# Patient Record
Sex: Female | Born: 1970 | Hispanic: Yes | Smoking: Current some day smoker
Health system: Southern US, Community
[De-identification: ages and names within clinical notes are randomized; demographics above are authoritative.]

## PROBLEM LIST (undated history)

## (undated) DIAGNOSIS — F310 Bipolar disorder, current episode hypomanic: Secondary | ICD-10-CM

## (undated) DIAGNOSIS — J45909 Unspecified asthma, uncomplicated: Secondary | ICD-10-CM

## (undated) HISTORY — DX: Bipolar disorder, current episode hypomanic: F31.0

## (undated) HISTORY — PX: OTHER SURGICAL HISTORY: SHX169

---

## 1995-12-18 HISTORY — PX: CHOLECYSTECTOMY: SHX55

## 2015-02-17 ENCOUNTER — Emergency Department (INDEPENDENT_AMBULATORY_CARE_PROVIDER_SITE_OTHER)
Admission: EM | Admit: 2015-02-17 | Discharge: 2015-02-17 | Disposition: A | Discharge status: Home / Self Care | Payer: Self-pay | Source: Home / Self Care | Attending: Family Medicine | Admitting: Family Medicine

## 2015-02-17 ENCOUNTER — Encounter (HOSPITAL_COMMUNITY): Payer: Self-pay | Admitting: *Deleted

## 2015-02-17 DIAGNOSIS — B356 Tinea cruris: Secondary | ICD-10-CM

## 2015-02-17 MED ORDER — NYSTATIN 100000 UNIT/GM EX CREA: TOPICAL_CREAM | CUTANEOUS | Status: DC

## 2015-02-17 NOTE — ED Notes (Signed)
Pt  Reports  Symptoms    Of  A  White  Spot   -  In  Vaginal  Area         That  Itches   She  Has   No  Discharge   She  Does however  Have  A  Rash  l  siode  Of  Vaginal  Area   denys  Any pain   denys  Any   Urinary  Symptoms

## 2015-02-17 NOTE — ED Provider Notes (Signed)
CSN: [id number]     Arrival date & time 02/17/15  7416 History   First MD Initiated Contact with Patient 02/17/15 1004     Chief Complaint  Patient presents with  . Vaginal Itching   (Consider location/radiation/quality/duration/timing/severity/associated sxs/prior Treatment) HPI Comments: Patient presents to report a two week history of a pruritic rash at bilateral groin folds and labia majora and minora. Has not tried any medications at home. Denies vaginal discharge or irregular bleeding. Reports herself to be otherwise healthy.  PCP: none  Patient is a 44 y.o. female presenting with vaginal itching. The history is provided by the patient.  Vaginal Itching This is a new problem.    History reviewed. No pertinent past medical history. History reviewed. No pertinent past surgical history. History reviewed. No pertinent family history. History  Substance Use Topics  . Smoking status: Never Smoker   . Smokeless tobacco: Not on file  . Alcohol Use: No   OB History    No data available     Review of Systems  Constitutional: Negative.   Genitourinary: Negative for dysuria, urgency, frequency, hematuria, flank pain, vaginal bleeding, vaginal discharge, difficulty urinating, genital sores, vaginal pain, menstrual problem and pelvic pain.  Musculoskeletal: Negative.   Skin: Negative.     Allergies  Review of patient's allergies indicates no known allergies.  Home Medications   Prior to Admission medications   Medication Sig Start Date End Date Taking? Authorizing Provider  nystatin cream (MYCOSTATIN) Apply to affected area 2 times daily x 14 days 02/17/15   Annett Gula H Presson, PA   BP 116/72 mmHg  Pulse 78  Temp(Src) 98.6 F (37 C) (Oral)  Resp 18  SpO2 100%  LMP 01/23/2015 Physical Exam  Constitutional: She is oriented to person, place, and time. She appears well-developed and well-nourished. No distress.  Cardiovascular: Normal rate.   Pulmonary/Chest: Effort  normal.  Genitourinary:    Pelvic exam was performed with patient supine. There is rash and lesion on the right labia. There is no tenderness or injury on the right labia. There is rash and lesion on the left labia. There is no tenderness or injury on the left labia. No erythema, tenderness or bleeding in the vagina. No foreign body around the vagina. No signs of injury around the vagina. No vaginal discharge found.  Outlined areas are regions of tinea cruris  Neurological: She is alert and oriented to person, place, and time.  Skin: Skin is warm and dry.  Psychiatric: She has a normal mood and affect. Her behavior is normal.  Nursing note and vitals reviewed.   ED Course  Procedures (including critical care time) Labs Review Labs Reviewed - No data to display  Imaging Review No results found.   MDM   1. Tinea cruris    Nystatin cream to affected areas x 14 days and if no improvement, please follow up at [facility] as patient has no access to outpatient primary or ObGyn care.     Lutricia Feil, [place] 02/17/15 1050

## 2016-02-15 ENCOUNTER — Emergency Department (HOSPITAL_COMMUNITY): Payer: Medicaid Other

## 2016-02-15 ENCOUNTER — Emergency Department (HOSPITAL_COMMUNITY)
Admission: EM | Admit: 2016-02-15 | Discharge: 2016-02-15 | Disposition: A | Discharge status: Home / Self Care | Payer: Medicaid Other | Attending: Emergency Medicine | Admitting: Emergency Medicine

## 2016-02-15 ENCOUNTER — Encounter (HOSPITAL_COMMUNITY): Payer: Self-pay | Admitting: *Deleted

## 2016-02-15 DIAGNOSIS — Y288XXA Contact with other sharp object, undetermined intent, initial encounter: Secondary | ICD-10-CM | POA: Insufficient documentation

## 2016-02-15 DIAGNOSIS — S61219A Laceration without foreign body of unspecified finger without damage to nail, initial encounter: Secondary | ICD-10-CM

## 2016-02-15 DIAGNOSIS — Y9289 Other specified places as the place of occurrence of the external cause: Secondary | ICD-10-CM | POA: Insufficient documentation

## 2016-02-15 DIAGNOSIS — Y998 Other external cause status: Secondary | ICD-10-CM | POA: Insufficient documentation

## 2016-02-15 DIAGNOSIS — Y9389 Activity, other specified: Secondary | ICD-10-CM | POA: Insufficient documentation

## 2016-02-15 DIAGNOSIS — Z23 Encounter for immunization: Secondary | ICD-10-CM | POA: Insufficient documentation

## 2016-02-15 DIAGNOSIS — S61213A Laceration without foreign body of left middle finger without damage to nail, initial encounter: Secondary | ICD-10-CM | POA: Insufficient documentation

## 2016-02-15 MED ORDER — IBUPROFEN 400 MG PO TABS
800.0000 mg | ORAL_TABLET | Freq: Once | ORAL | Status: AC
  Administered 2016-02-15: 800 mg via ORAL
  Filled 2016-02-15: qty 2

## 2016-02-15 MED ORDER — TETANUS-DIPHTH-ACELL PERTUSSIS 5-2.5-18.5 LF-MCG/0.5 IM SUSP
0.5000 mL | Freq: Once | INTRAMUSCULAR | Status: AC
  Administered 2016-02-15: 0.5 mL via INTRAMUSCULAR
  Filled 2016-02-15: qty 0.5

## 2016-02-15 NOTE — Discharge Instructions (Signed)
Keep wound clean and dry. Monitor for any redness, drainage, fever, etc. Follow-up with your primary care doctor. Return here for new concerns.

## 2016-02-15 NOTE — ED Notes (Signed)
Pt reports injuring/cutting left middle finger last night on cabinet. Small cut noted to finger, no bleeding, bandage applied.

## 2016-02-15 NOTE — ED Provider Notes (Signed)
CSN: [id number]     Arrival date & time 02/15/16  1047 History  By signing my name below, I, Gina Burton, attest that this documentation has been prepared under the direction and in the presence of non-physician practitioner, Quincy Carnes, PA-C. Electronically Signed: Evelene Burton, Scribe. 02/15/2016. 1:52 PM.    Chief Complaint  Patient presents with  . Laceration  . Finger Injury    The history is provided by the patient. No language interpreter was used.     HPI Comments:  Gina Burton is a 45 y.o. female who presents to the Emergency Department complaining of a small left middle finger laceration which occurred ~2300 last night. She reports cutting the finger on an unhinged cabinet. She also reports associated pain to the site that radiates into her left hand. No alleviating factors noted. Pt has no other complaints or symptoms at this time. Tetanus is out of date.  History reviewed. No pertinent past medical history. History reviewed. No pertinent past surgical history. History reviewed. No pertinent family history. Social History  Substance Use Topics  . Smoking status: Never Smoker   . Smokeless tobacco: None  . Alcohol Use: No   OB History    No data available     Review of Systems  Constitutional: Negative for fever and chills.  Respiratory: Negative for shortness of breath.   Cardiovascular: Negative for chest pain.  Skin: Positive for wound.  All other systems reviewed and are negative.   Allergies  Review of patient's allergies indicates no known allergies.  Home Medications   Prior to Admission medications   Not on File   BP 100/75 mmHg  Pulse 66  Temp(Src) 98.8 F (37.1 C) (Oral)  Resp 18  SpO2 99%   Physical Exam  Constitutional: She is oriented to person, place, and time. She appears well-developed and well-nourished.  HENT:  Head: Normocephalic and atraumatic.  Mouth/Throat: Oropharynx is clear and moist.  Eyes: Conjunctivae and EOM are  normal. Pupils are equal, round, and reactive to light.  Neck: Normal range of motion.  Cardiovascular: Normal rate, regular rhythm and normal heart sounds.   Pulmonary/Chest: Effort normal and breath sounds normal.  Abdominal: Soft. Bowel sounds are normal.  Musculoskeletal: Normal range of motion.       Hands: 1cm laceration to dorsal distal left middle finger, perpendicular to fingernail; nail and nailbed remain intact; full flexion/extension of finger maintained; no bony deformities noted; normal cap refill, no overlying skin changes; normal sensation; no other wounds noted  Neurological: She is alert and oriented to person, place, and time.  Skin: Skin is warm and dry.  Psychiatric: She has a normal mood and affect.  Nursing note and vitals reviewed.   ED Course  Procedures   DIAGNOSTIC STUDIES:  Oxygen Saturation is 99% on RA, normal by my interpretation.    COORDINATION OF CARE:  1:21 PM Will clean the wounds and update tetanus in the ED. Discussed treatment plan with pt at bedside and pt agreed to plan.  Imaging Review Dg Finger Middle Left  02/15/2016  CLINICAL DATA:  Injury, pain. Cut left middle finger last night on cabinet. EXAM: LEFT MIDDLE FINGER 2+V COMPARISON:  None. FINDINGS: No acute bony abnormality. Specifically, no fracture, subluxation, or dislocation. Soft tissues are intact. IMPRESSION: No acute bony abnormality. Electronically Signed   By: Rolm Baptise M.D.   On: 02/15/2016 12:03   I have personally reviewed and evaluated these images as part of my medical decision-making.  MDM   Final diagnoses:  Finger laceration, initial encounter   45 year old female here with wound to left middle finger. She accidentally cut it on a cabinet last night. She has a 1cm laceration to dorsal distal left middle finger perpendicular to fingernail. Fingernail and nail bed remained intact. She has no bony deformities on exam. No clinical signs of infection. Hand is  neurovascularly intact. X-ray negative for acute bony findings. Wound was cleansed here, tetanus updated, dressing applied.  Given that wound is >8 hours old, will not close at this time.  D/c home with supportive care.  Discussed plan with patient, he/she acknowledged understanding and agreed with plan of care.  Return precautions given for new or worsening symptoms.  I personally performed the services described in this documentation, which was scribed in my presence. The recorded information has been reviewed and is accurate.  Larene Pickett, PA-C 02/15/16 [place], MD 02/15/16 [phone]

## 2016-03-11 ENCOUNTER — Encounter (HOSPITAL_COMMUNITY): Payer: Self-pay | Admitting: Emergency Medicine

## 2016-03-11 ENCOUNTER — Emergency Department (HOSPITAL_COMMUNITY): Payer: Medicaid Other

## 2016-03-11 DIAGNOSIS — R079 Chest pain, unspecified: Secondary | ICD-10-CM | POA: Insufficient documentation

## 2016-03-11 DIAGNOSIS — J111 Influenza due to unidentified influenza virus with other respiratory manifestations: Secondary | ICD-10-CM | POA: Insufficient documentation

## 2016-03-11 DIAGNOSIS — R1030 Lower abdominal pain, unspecified: Secondary | ICD-10-CM | POA: Insufficient documentation

## 2016-03-11 DIAGNOSIS — R0682 Tachypnea, not elsewhere classified: Secondary | ICD-10-CM | POA: Insufficient documentation

## 2016-03-11 DIAGNOSIS — Z3202 Encounter for pregnancy test, result negative: Secondary | ICD-10-CM | POA: Insufficient documentation

## 2016-03-11 LAB — URINALYSIS, ROUTINE W REFLEX MICROSCOPIC
BILIRUBIN URINE: NEGATIVE
Glucose, UA: NEGATIVE mg/dL
Hgb urine dipstick: NEGATIVE
KETONES UR: 15 mg/dL — AB
LEUKOCYTES UA: NEGATIVE
NITRITE: NEGATIVE
PH: 7.5 (ref 5.0–8.0)
Protein, ur: NEGATIVE mg/dL
Specific Gravity, Urine: 1.016 (ref 1.005–1.030)

## 2016-03-11 LAB — CBC
HEMATOCRIT: 33.4 % — AB (ref 36.0–46.0)
HEMOGLOBIN: 11.1 g/dL — AB (ref 12.0–15.0)
MCH: 28.4 pg (ref 26.0–34.0)
MCHC: 33.2 g/dL (ref 30.0–36.0)
MCV: 85.4 fL (ref 78.0–100.0)
Platelets: 250 10*3/uL (ref 150–400)
RBC: 3.91 MIL/uL (ref 3.87–5.11)
RDW: 14.7 % (ref 11.5–15.5)
WBC: 8.3 10*3/uL (ref 4.0–10.5)

## 2016-03-11 LAB — BASIC METABOLIC PANEL
ANION GAP: 11 (ref 5–15)
BUN: 18 mg/dL (ref 6–20)
CO2: 21 mmol/L — ABNORMAL LOW (ref 22–32)
Calcium: 8.8 mg/dL — ABNORMAL LOW (ref 8.9–10.3)
Chloride: 108 mmol/L (ref 101–111)
Creatinine, Ser: 0.66 mg/dL (ref 0.44–1.00)
Glucose, Bld: 96 mg/dL (ref 65–99)
POTASSIUM: 3.4 mmol/L — AB (ref 3.5–5.1)
Sodium: 140 mmol/L (ref 135–145)

## 2016-03-11 LAB — I-STAT TROPONIN, ED: Troponin i, poc: 0 ng/mL (ref 0.00–0.08)

## 2016-03-11 LAB — POC URINE PREG, ED: Preg Test, Ur: NEGATIVE

## 2016-03-11 NOTE — ED Notes (Signed)
Pt states for the last 3 days she has had a productive cough with yellow thick mucus and today started having tightness in her chest and unable to lay down. Pt also reports chills and body aches.

## 2016-03-12 ENCOUNTER — Emergency Department (HOSPITAL_COMMUNITY)
Admission: EM | Admit: 2016-03-12 | Discharge: 2016-03-12 | Disposition: A | Discharge status: Home / Self Care | Payer: Medicaid Other | Attending: Emergency Medicine | Admitting: Emergency Medicine

## 2016-03-12 DIAGNOSIS — J111 Influenza due to unidentified influenza virus with other respiratory manifestations: Secondary | ICD-10-CM

## 2016-03-12 LAB — PREGNANCY, URINE: Preg Test, Ur: NEGATIVE

## 2016-03-12 MED ORDER — IPRATROPIUM-ALBUTEROL 0.5-2.5 (3) MG/3ML IN SOLN
3.0000 mL | RESPIRATORY_TRACT | Status: DC
  Administered 2016-03-12: 3 mL via RESPIRATORY_TRACT
  Filled 2016-03-12: qty 3

## 2016-03-12 MED ORDER — IBUPROFEN 600 MG PO TABS: 600.0000 mg | ORAL_TABLET | Freq: Four times a day (QID) | ORAL | Status: DC | PRN

## 2016-03-12 MED ORDER — KETOROLAC TROMETHAMINE 60 MG/2ML IM SOLN
60.0000 mg | Freq: Once | INTRAMUSCULAR | Status: AC
  Administered 2016-03-12: 60 mg via INTRAMUSCULAR
  Filled 2016-03-12: qty 2

## 2016-03-12 MED ORDER — METOCLOPRAMIDE HCL 10 MG PO TABS
10.0000 mg | ORAL_TABLET | Freq: Once | ORAL | Status: AC
  Administered 2016-03-12: 10 mg via ORAL
  Filled 2016-03-12: qty 1

## 2016-03-12 NOTE — ED Provider Notes (Signed)
CSN: [id number]     Arrival date & time 03/11/16  2214 History  By signing my name below, I, Altamease Oiler, attest that this documentation has been prepared under the direction and in the presence of Everlene Balls, MD. Electronically Signed: Altamease Oiler, ED Scribe. 03/12/2016. 1:33 AM   Chief Complaint  Patient presents with  . Cough  . Chest Pain   The history is provided by the patient. A language interpreter was used ([place]).   Gina Burton is a 45 y.o. female who presents to the Emergency Department complaining of cough productive of thick  Yellow sputum  with onset 3 days ago. Cough drops and Theraflu have provided insufficient relief in symptoms at home. Associated symptoms include decreased appetite,  headache, chills, myalgias, SOB, chest pain, and nausea. Pt denies vomiting. She did not have a flu shot this year. More than 5 years ago she had gallbladder surgery.   Pt is Spanish- Speaking. A relative is at bedside translating.   History reviewed. No pertinent past medical history. History reviewed. No pertinent past surgical history. No family history on file. Social History  Substance Use Topics  . Smoking status: Never Smoker   . Smokeless tobacco: None  . Alcohol Use: No   OB History    No data available     Review of Systems  10 Systems reviewed and all are negative for acute change except as noted in the HPI.  Allergies  Review of patient's allergies indicates no known allergies.  Home Medications   Prior to Admission medications   Medication Sig Start Date End Date Taking? Authorizing Provider  nystatin cream (MYCOSTATIN) Apply to affected area 2 times daily x 14 days Patient not taking: Reported on 03/12/2016 02/17/15   Audelia Hives Presson, PA   BP 111/75 mmHg  Pulse 93  Temp(Src) 98 F (36.7 C) (Oral)  Resp 16  Ht 5\' 6"  (1.676 m)  Wt 140 lb (63.504 kg)  BMI 22.61 kg/m2  SpO2 98%  LMP  Physical Exam  Constitutional: She is oriented  to person, place, and time. She appears well-developed and well-nourished. No distress.  HENT:  Head: Normocephalic and atraumatic.  Nose: Nose normal.  Mouth/Throat: Oropharynx is clear and moist. No oropharyngeal exudate.  Eyes: Conjunctivae and EOM are normal. Pupils are equal, round, and reactive to light. No scleral icterus.  Neck: Normal range of motion. Neck supple. No JVD present. No tracheal deviation present. No thyromegaly present.  Cardiovascular: Normal rate, regular rhythm and normal heart sounds.  Exam reveals no gallop and no friction rub.   No murmur heard. Pulmonary/Chest: Effort normal and breath sounds normal. Tachypnea noted. No respiratory distress. She has no wheezes. She exhibits no tenderness.  Abdominal: Soft. Bowel sounds are normal. She exhibits no distension and no mass. There is tenderness in the suprapubic area. There is no rebound and no guarding.  Musculoskeletal: Normal range of motion. She exhibits no edema or tenderness.  Lymphadenopathy:    She has no cervical adenopathy.  Neurological: She is alert and oriented to person, place, and time. No cranial nerve deficit. She exhibits normal muscle tone.  Skin: Skin is warm and dry. No rash noted. No erythema. No pallor.  Nursing note and vitals reviewed.   ED Course  Procedures (including critical care time) DIAGNOSTIC STUDIES: Oxygen Saturation is 98% on RA,  normal by my interpretation.    COORDINATION OF CARE: 1:13 AM Discussed treatment plan which includes lab work, CXR, EKG, Toradol, Reglan,  and a breathing treatment with pt at bedside and pt agreed to plan.  Labs Review Labs Reviewed  BASIC METABOLIC PANEL - Abnormal; Notable for the following:    Potassium 3.4 (*)    CO2 21 (*)    Calcium 8.8 (*)    All other components within normal limits  CBC - Abnormal; Notable for the following:    Hemoglobin 11.1 (*)    HCT 33.4 (*)    All other components within normal limits  URINALYSIS, ROUTINE W  REFLEX MICROSCOPIC (NOT AT [facility]) - Abnormal; Notable for the following:    APPearance CLOUDY (*)    Ketones, ur 15 (*)    All other components within normal limits  PREGNANCY, URINE  I-STAT TROPOININ, ED  POC URINE PREG, ED    Imaging Review Dg Chest 2 View  03/12/2016  CLINICAL DATA:  Acute onset of mid chest pain and cough. Initial encounter. EXAM: CHEST  2 VIEW COMPARISON:  None. FINDINGS: The lungs are well-aerated and clear. There is no evidence of focal opacification, pleural effusion or pneumothorax. The heart is normal in size; the mediastinal contour is within normal limits. No acute osseous abnormalities are seen. IMPRESSION: No acute cardiopulmonary process seen. Electronically Signed   By: Garald Balding M.D.   On: 03/12/2016 00:32   I have personally reviewed and evaluated these images and lab results as part of my medical decision-making.   EKG Interpretation   Date/Time:  Sunday March 11 2016 22:46:37 EDT Ventricular Rate:  92 PR Interval:  148 QRS Duration: 76 QT Interval:  360 QTC Calculation: 445 R Axis:   61 Text Interpretation:  Normal sinus rhythm Normal ECG No old tracing to  compare Confirmed by Glynn Octave [phone]) on 03/12/2016 1:08:37 AM      MDM   Final diagnoses:  None    Patient presents to the ED with flu like complaints.  Education was provided.  She was given toradol, reglan and duoneb treatment.  Upon repeat evaluation, patient states she feels much better.  She is no longer tachypneic.  She was resting comfortably in bed and in NAD.  Will DC home with ibuprofen to take as needed. PCP fu advised within3  Days.  VS remain within her normal limits and she is safe for DC.   I personally performed the services described in this documentation, which was scribed in my presence. The recorded information has been reviewed and is accurate.      Everlene Balls, MD 03/12/16 [phone]

## 2016-03-12 NOTE — Discharge Instructions (Signed)
Gripe - Adultos (Influenza, Adult)  Ms. Gina Burton, your blood work and chest xray were normal.  You likely have the flu.  Take ibuprofen as needed for pain or fever and see a primary care doctor within 3 days for close follow up. If any symptoms worsen, come back to the ED immediately. Thank you.    [name], su anlisis de sangre y la radiografa de trax eran normales. Es probable que Youth worker. Tome ibuprofeno segn sea necesario para el dolor o la fiebre y consulte a un mdico de atencin primaria dentro de 3 das para un seguimiento cercano. Si los sntomas empeoran, vuelva a la ED inmediatamente. Gracias.   La gripe (influenza) es una infeccin en la boca, la nariz y la garganta (tracto respiratorio) causada por un virus. La gripe puede hacer que se sienta muy mal. Se disemina fcilmente de Ardelia Mems persona a otra (se contagia).  CUIDADOS EN EL HOGAR   Tome slo los medicamentos que le haya indicado el mdico.  Utilice un humidificador de niebla fra para facilitar la respiracin.  Descanse todo lo que pueda [employer] la fiebre haya bajado. Generalmente esto lleva entre 3 y 4 das.  Beba gran cantidad de lquido para mantener el pis (orina) de tono claro o amarillo plido.  Cbrase la boca y la Doran Durand al toser o estornudar.  Lave bien sus manos para evitar el contagio de la enfermedad.  Foy [place] en su casa y no concurra al [place] o a la escuela hasta que la fiebre haya desaparecido al menos por 1 da completo.  Colquese la vacuna antigripal todos los [place]. SOLICITE AYUDA DE INMEDIATO SI:   Siente dificultad para respirar o Risk manager.  Observa que la piel o las uas estn [place].  Presenta dolor intenso o rigidez en el cuello.  Comienza a sentir dolor de cabeza, en el rostro o en los odos.  La fiebre empeora o aparece nuevamente.  Tiene Higher education careers adviser (nuseas), vmitos, o tiene deposiciones acuosas (diarrea.  Siente dolor en el  pecho.  Tiene una tos intensa que empeora o elimina un esputo ms espeso. ASEGRESE DE QUE:   Comprende estas instrucciones.  Controlar su enfermedad.  Solicitar ayuda de inmediato si no mejora o si empeora.   Esta informacin no tiene Marine scientist el consejo del mdico. Asegrese de hacerle al mdico cualquier pregunta que tenga.   Document Released: 03/01/2009 Document Revised: 12/24/2014 Elsevier Interactive Patient Education 2016 [place]

## 2016-08-22 ENCOUNTER — Emergency Department (HOSPITAL_COMMUNITY): Payer: Medicaid Other

## 2016-08-22 ENCOUNTER — Encounter (HOSPITAL_COMMUNITY): Payer: Self-pay | Admitting: Emergency Medicine

## 2016-08-22 ENCOUNTER — Emergency Department (HOSPITAL_COMMUNITY)
Admission: EM | Admit: 2016-08-22 | Discharge: 2016-08-22 | Disposition: A | Discharge status: Home / Self Care | Payer: Medicaid Other | Attending: Emergency Medicine | Admitting: Emergency Medicine

## 2016-08-22 DIAGNOSIS — Y9301 Activity, walking, marching and hiking: Secondary | ICD-10-CM | POA: Insufficient documentation

## 2016-08-22 DIAGNOSIS — S52201A Unspecified fracture of shaft of right ulna, initial encounter for closed fracture: Secondary | ICD-10-CM

## 2016-08-22 DIAGNOSIS — F17200 Nicotine dependence, unspecified, uncomplicated: Secondary | ICD-10-CM | POA: Insufficient documentation

## 2016-08-22 DIAGNOSIS — J45909 Unspecified asthma, uncomplicated: Secondary | ICD-10-CM | POA: Insufficient documentation

## 2016-08-22 DIAGNOSIS — S52601A Unspecified fracture of lower end of right ulna, initial encounter for closed fracture: Secondary | ICD-10-CM | POA: Insufficient documentation

## 2016-08-22 DIAGNOSIS — Y929 Unspecified place or not applicable: Secondary | ICD-10-CM | POA: Insufficient documentation

## 2016-08-22 DIAGNOSIS — Y999 Unspecified external cause status: Secondary | ICD-10-CM | POA: Insufficient documentation

## 2016-08-22 DIAGNOSIS — W010XXA Fall on same level from slipping, tripping and stumbling without subsequent striking against object, initial encounter: Secondary | ICD-10-CM | POA: Insufficient documentation

## 2016-08-22 HISTORY — DX: Unspecified asthma, uncomplicated: J45.909

## 2016-08-22 MED ORDER — OXYCODONE-ACETAMINOPHEN 5-325 MG PO TABS: 1.0000 | ORAL_TABLET | Freq: Four times a day (QID) | ORAL | 0 refills | Status: DC | PRN

## 2016-08-22 NOTE — ED Notes (Signed)
Bed: WTR9 Expected date:  Expected time:  Means of arrival:  Comments: 

## 2016-08-22 NOTE — ED Triage Notes (Signed)
Per EMS pt states she tripped and fell onto curb injuring R wrist. Pt does not speak english

## 2016-08-22 NOTE — ED Provider Notes (Signed)
[facility] DEPT Provider Note   CSN: [id number] Arrival date & time: 08/22/16  0106  By signing my name below, I, Dora Sims, attest that this documentation has been prepared under the direction and in the presence of physician practitioner, Adalynn Corne, MD. Electronically Signed: Dora Sims, Scribe. 08/22/2016. 3:42 AM.  History   Chief Complaint Chief Complaint  Patient presents with  . Wrist Pain    The history is provided by the patient. No language interpreter was used.  Wrist Pain  This is a new problem. The current episode started 1 to 2 hours ago. The problem occurs constantly. The problem has not changed since onset.Pertinent negatives include no chest pain, no abdominal pain, no headaches and no shortness of breath. Nothing aggravates the symptoms. Nothing relieves the symptoms. She has tried nothing for the symptoms. The treatment provided no relief.     HPI Comments: Gina Burton is a 45 y.o. female brought in by EMS who presents to the Emergency Department complaining of right wrist injury sustained about 2 hours ago. Pt reports she tripped on a curb and put her right hand on the pavement to brace her fall. She denies hitting her head or LOC. Pt endorses severe right wrist pain currently. She denies numbness or any other associated symptoms.  Past Medical History:  Diagnosis Date  . Asthma   . Depression     There are no active problems to display for this patient.   Past Surgical History:  Procedure Laterality Date  . arm surgery    . CHOLECYSTECTOMY      OB History    No data available       Home Medications    Prior to Admission medications   Not on File    Family History No family history on file.  Social History Social History  Substance Use Topics  . Smoking status: Current Some Day Smoker  . Smokeless tobacco: Never Used  . Alcohol use No     Allergies   Review of patient's allergies indicates no known  allergies.   Review of Systems Review of Systems  Eyes: Negative for photophobia.  Respiratory: Negative for shortness of breath.   Cardiovascular: Negative for chest pain.  Gastrointestinal: Negative for abdominal pain.  Musculoskeletal: Positive for arthralgias (right wrist). Negative for back pain.  Neurological: Negative for syncope, numbness and headaches.  All other systems reviewed and are negative.   Physical Exam Updated Vital Signs BP 112/76   Pulse 68   Temp 97.6 F (36.4 C) (Oral)   Resp 18   LMP 08/14/2016   SpO2 99%   Physical Exam  Constitutional: She is oriented to person, place, and time. She appears well-developed and well-nourished.  HENT:  Head: Normocephalic. Head is without raccoon's eyes and without Battle's sign.  Mouth/Throat: Oropharynx is clear and moist. No oropharyngeal exudate.  Eyes: Conjunctivae and EOM are normal. Pupils are equal, round, and reactive to light. Right eye exhibits no discharge. Left eye exhibits no discharge. No scleral icterus.  Neck: Normal range of motion. Neck supple. No JVD present. No tracheal deviation present.  Trachea is midline. No stridor or carotid bruits.   Cardiovascular: Normal rate, regular rhythm, normal heart sounds and intact distal pulses.   No murmur heard. Pulmonary/Chest: Effort normal and breath sounds normal. No stridor. No respiratory distress. She has no wheezes. She has no rales.  Lungs CTA bilaterally.  Abdominal: Soft. Bowel sounds are normal. She exhibits no distension. There is no tenderness.  There is no rebound and no guarding.  Musculoskeletal: Normal range of motion. She exhibits no edema.       Right wrist: She exhibits tenderness.       Right hand: Normal. She exhibits normal capillary refill. Normal sensation noted. Normal strength noted.  No step offs or crepitus of cervical or upper thoracic spine.  Lymphadenopathy:    She has no cervical adenopathy.  Neurological: She is alert and  oriented to person, place, and time. She has normal reflexes. She displays normal reflexes. She exhibits normal muscle tone.  Skin: Skin is warm and dry. Capillary refill takes less than 2 seconds.  Psychiatric: She has a normal mood and affect. Her behavior is normal.  Nursing note and vitals reviewed.   ED Treatments / Results  Labs (all labs ordered are listed, but only abnormal results are displayed) Labs Reviewed - No data to display  Radiology Dg Wrist Complete Right  Result Date: 08/22/2016 CLINICAL DATA:  45 year old female with trauma to the right forearm EXAM: RIGHT WRIST - COMPLETE 3+ VIEW COMPARISON:  None. FINDINGS: There is a nondisplaced transverse fracture of the distal ulna with minimal volar angulation of the distal fracture fragment. No other fracture identified. The carpal bones appear intact. There is no dislocation. The soft tissues are grossly unremarkable. IMPRESSION: Nondisplaced fracture of the distal ulnar diaphysis. Electronically Signed   By: Anner Crete M.D.   On: 08/22/2016 02:22   Medications - No data to display  Procedures Procedures (including critical care time)  DIAGNOSTIC STUDIES: Oxygen Saturation is 99% on RA, normal by my interpretation.    COORDINATION OF CARE: 3:42 AM Discussed treatment plan with pt at bedside and pt agreed to plan.  Medications Ordered in ED Medications - No data to display  Vitals:   08/22/16 0142  BP: 112/76  Pulse: 68  Resp: 18  Temp: 97.6 F (36.4 C)    Initial Impression / Assessment and Plan / ED Course  I have reviewed the triage vital signs and the nursing notes.  Pertinent labs & imaging results that were available during my care of the patient were reviewed by me and considered in my medical decision making (see chart for details).  Clinical Course  Splinted in the ED by the ortho tech  I personally performed the services described in this documentation, which was scribed in my presence. The  recorded information has been reviewed and is accurate.    All questions answered to patient's satisfaction. Call Dr. Amedeo Plenty to follow up this week for definitive care.  Based on history and exam patient has been appropriately medically screened and emergency conditions excluded. Patient is stable for discharge at this time. Follow up with your PMD for recheck in 2 days and strict return precautions given.  Final Clinical Impressions(s) / ED Diagnoses   Final diagnoses:  None    New Prescriptions New Prescriptions   No medications on file     Franki Stemen, MD 08/22/16 0505

## 2016-08-22 NOTE — ED Triage Notes (Signed)
Triage completed with use of interpretor line. Pt states while walking down road and tripped over stop sign, fell onto R arm.

## 2016-08-22 NOTE — Progress Notes (Signed)
Orthopedic Tech Progress Note Patient Details:  Curtrina Suh 02-01-1971 [id number]  Ortho Devices Type of Ortho Device: Arm sling, Ulna gutter splint Ortho Device/Splint Location: rue Ortho Device/Splint Interventions: Ordered, Application   Karolee Stamps 08/22/2016, 4:23 AM

## 2016-11-03 ENCOUNTER — Emergency Department (HOSPITAL_COMMUNITY)
Admission: EM | Admit: 2016-11-03 | Discharge: 2016-11-03 | Disposition: A | Discharge status: Home / Self Care | Payer: Self-pay | Attending: Emergency Medicine | Admitting: Emergency Medicine

## 2016-11-03 ENCOUNTER — Encounter (HOSPITAL_COMMUNITY): Payer: Self-pay | Admitting: Emergency Medicine

## 2016-11-03 ENCOUNTER — Emergency Department (HOSPITAL_COMMUNITY): Payer: Self-pay

## 2016-11-03 DIAGNOSIS — R1013 Epigastric pain: Secondary | ICD-10-CM | POA: Insufficient documentation

## 2016-11-03 DIAGNOSIS — J45909 Unspecified asthma, uncomplicated: Secondary | ICD-10-CM | POA: Insufficient documentation

## 2016-11-03 DIAGNOSIS — N3000 Acute cystitis without hematuria: Secondary | ICD-10-CM | POA: Insufficient documentation

## 2016-11-03 LAB — LIPASE, BLOOD: Lipase: 29 U/L (ref 11–51)

## 2016-11-03 LAB — COMPREHENSIVE METABOLIC PANEL
ALBUMIN: 3.3 g/dL — AB (ref 3.5–5.0)
ALK PHOS: 68 U/L (ref 38–126)
ALT: 23 U/L (ref 14–54)
ANION GAP: 8 (ref 5–15)
AST: 40 U/L (ref 15–41)
BUN: 12 mg/dL (ref 6–20)
CO2: 21 mmol/L — AB (ref 22–32)
Calcium: 7.9 mg/dL — ABNORMAL LOW (ref 8.9–10.3)
Chloride: 111 mmol/L (ref 101–111)
Creatinine, Ser: 0.65 mg/dL (ref 0.44–1.00)
GFR calc Af Amer: >60 mL/min (ref >60)
GFR calc non Af Amer: >60 mL/min (ref >60)
Glucose, Bld: 85 mg/dL (ref 65–99)
Potassium: 3.4 mmol/L — ABNORMAL LOW (ref 3.5–5.1)
SODIUM: 140 mmol/L (ref 135–145)
Total Bilirubin: 0.5 mg/dL (ref 0.3–1.2)
Total Protein: 5.6 g/dL — ABNORMAL LOW (ref 6.5–8.1)

## 2016-11-03 LAB — URINE MICROSCOPIC-ADD ON

## 2016-11-03 LAB — URINALYSIS, ROUTINE W REFLEX MICROSCOPIC
BILIRUBIN URINE: NEGATIVE
Glucose, UA: NEGATIVE mg/dL
Ketones, ur: NEGATIVE mg/dL
Nitrite: NEGATIVE
Protein, ur: NEGATIVE mg/dL
SPECIFIC GRAVITY, URINE: 1.013 (ref 1.005–1.030)
pH: 7.5 (ref 5.0–8.0)

## 2016-11-03 LAB — I-STAT CG4 LACTIC ACID, ED: Lactic Acid, Venous: 1.25 mmol/L (ref 0.5–1.9)

## 2016-11-03 LAB — CBC
HCT: 32 % — ABNORMAL LOW (ref 36.0–46.0)
HEMOGLOBIN: 10.3 g/dL — AB (ref 12.0–15.0)
MCH: 27.7 pg (ref 26.0–34.0)
MCHC: 32.2 g/dL (ref 30.0–36.0)
MCV: 86 fL (ref 78.0–100.0)
Platelets: 203 10*3/uL (ref 150–400)
RBC: 3.72 MIL/uL — ABNORMAL LOW (ref 3.87–5.11)
RDW: 15.3 % (ref 11.5–15.5)
WBC: 13.8 10*3/uL — ABNORMAL HIGH (ref 4.0–10.5)

## 2016-11-03 LAB — I-STAT BETA HCG BLOOD, ED (MC, WL, AP ONLY): I-stat hCG, quantitative: <5 m[IU]/mL (ref <5)

## 2016-11-03 LAB — LITHIUM LEVEL: Lithium Lvl: <0.06 mmol/L — ABNORMAL LOW (ref 0.60–1.20)

## 2016-11-03 MED ORDER — GI COCKTAIL ~~LOC~~
30.0000 mL | Freq: Once | ORAL | Status: AC
  Administered 2016-11-03: 30 mL via ORAL
  Filled 2016-11-03: qty 30

## 2016-11-03 MED ORDER — IOPAMIDOL (ISOVUE-300) INJECTION 61%
INTRAVENOUS | Status: AC
  Administered 2016-11-03: 100 mL
  Filled 2016-11-03: qty 100

## 2016-11-03 MED ORDER — FAMOTIDINE 20 MG PO TABS: 20.0000 mg | ORAL_TABLET | Freq: Two times a day (BID) | ORAL | 0 refills | Status: DC

## 2016-11-03 MED ORDER — DICYCLOMINE HCL 20 MG PO TABS: 20.0000 mg | ORAL_TABLET | Freq: Two times a day (BID) | ORAL | 0 refills | Status: DC | PRN

## 2016-11-03 MED ORDER — SULFAMETHOXAZOLE-TRIMETHOPRIM 800-160 MG PO TABS: 1.0000 | ORAL_TABLET | Freq: Two times a day (BID) | ORAL | 0 refills | Status: AC

## 2016-11-03 MED ORDER — SODIUM CHLORIDE 0.9 % IV BOLUS (SEPSIS)
1000.0000 mL | Freq: Once | INTRAVENOUS | Status: AC
  Administered 2016-11-03: 1000 mL via INTRAVENOUS

## 2016-11-03 MED ORDER — FENTANYL CITRATE (PF) 100 MCG/2ML IJ SOLN
50.0000 ug | Freq: Once | INTRAMUSCULAR | Status: AC
  Administered 2016-11-03: 50 ug via INTRAVENOUS
  Filled 2016-11-03: qty 2

## 2016-11-03 MED ORDER — DEXTROSE 5 % IV SOLN
1.0000 g | Freq: Once | INTRAVENOUS | Status: AC
  Administered 2016-11-03: 1 g via INTRAVENOUS
  Filled 2016-11-03: qty 10

## 2016-11-03 MED ORDER — ONDANSETRON HCL 4 MG/2ML IJ SOLN
4.0000 mg | Freq: Once | INTRAMUSCULAR | Status: AC
  Administered 2016-11-03: 4 mg via INTRAVENOUS
  Filled 2016-11-03: qty 2

## 2016-11-03 NOTE — ED Notes (Signed)
Spoke to [place], aware BP is low and believes poss baseline for patient. Order to hold last collected lactic acid.

## 2016-11-03 NOTE — ED Notes (Signed)
Patient given food and water.

## 2016-11-03 NOTE — ED Notes (Signed)
Patient at Xray.

## 2016-11-03 NOTE — Discharge Instructions (Signed)
Read the information below.  Use the prescribed medication as directed.  Please discuss all new medications with your pharmacist.  You may return to the Emergency Department at any time for worsening condition or any new symptoms that concern you.     If you develop high fevers, worsening abdominal pain, uncontrolled vomiting, or are unable to tolerate fluids by mouth, return to the ER for a recheck.   ° °Lea la información a continuación. Use la medicación prescrita según lo dirigido. Discuta todos los medicamentos nuevos con su farmacéutico. Puede regresar al Departamento de Emergencia en cualquier momento por empeoramiento de la condición o cualquier síntoma nuevo que le preocupe. Si desarrolla fiebre alta, empeoramiento del dolor abdominal, vómitos descontrolados o no puede tolerar los líquidos por la boca, regrese a la sala de emergencias para una nueva verificación. ° ° °

## 2016-11-03 NOTE — ED Provider Notes (Signed)
[facility] DEPT Provider Note   CSN: [id number] Arrival date & time: 11/03/16  0609     History   Chief Complaint Chief Complaint  Patient presents with  . Abdominal Pain    HPI Gina Burton is a 45 y.o. female.  HPI   Patient presents with right sided abdominal pain that began suddenly this morning.  Pain is described as a pressure.  It radiates into her chest.  Associated nausea.  Denies vomiting, diarrhea, constipation, bowel changes, dysuria, abnormal vaginal discharge or bleeding.   LMP early November.  Last BM was this morning, was normal.  Has hx abdominal surgery: cholecystectomy only.   Chronically takes lithium, seroquel, vistaril.  Denies taking NSAIDs/aspirin, drinking ETOH.  Denies hx reflux, kidney stones, ovarian cysts.    History reviewed. No pertinent past medical history.  There are no active problems to display for this patient.   Past Surgical History:  Procedure Laterality Date  . CHOLECYSTECTOMY      OB History    No data available       Home Medications    Prior to Admission medications   Medication Sig Start Date End Date Taking? Authorizing Provider  dicyclomine (BENTYL) 20 MG tablet Take 1 tablet (20 mg total) by mouth 2 (two) times daily as needed for spasms (abdominal pain). 11/03/16   Clayton Bibles, PA-C  famotidine (PEPCID) 20 MG tablet Take 1 tablet (20 mg total) by mouth 2 (two) times daily. 11/03/16   Clayton Bibles, PA-C  ibuprofen (ADVIL,MOTRIN) 600 MG tablet Take 1 tablet (600 mg total) by mouth every 6 (six) hours as needed. Patient not taking: Reported on 11/03/2016 03/12/16   Everlene Balls, MD  sulfamethoxazole-trimethoprim (BACTRIM DS,SEPTRA DS) 800-160 MG tablet Take 1 tablet by mouth 2 (two) times daily. 11/03/16 11/06/16  Clayton Bibles, PA-C    Family History History reviewed. No pertinent family history.  Social History Social History  Substance Use Topics  . Smoking status: Never Smoker  . Smokeless tobacco: Not on  file  . Alcohol use No     Allergies   Patient has no known allergies.   Review of Systems Review of Systems  All other systems reviewed and are negative.    Physical Exam Updated Vital Signs BP 105/85   Pulse 75   Temp 98.4 F (36.9 C) (Oral)   Resp 12   LMP 10/17/2016   SpO2 100%   Physical Exam  Constitutional: She appears well-developed and well-nourished. No distress.  HENT:  Head: Normocephalic and atraumatic.  Neck: Neck supple.  Cardiovascular: Normal rate and regular rhythm.   Pulmonary/Chest: Effort normal and breath sounds normal. No respiratory distress. She has no wheezes. She has no rales.  Abdominal: Soft. She exhibits no distension. There is tenderness in the right upper quadrant, epigastric area and periumbilical area. There is no rigidity and no rebound.  Neurological: She is alert.  Skin: She is not diaphoretic.  Nursing note and vitals reviewed.    ED Treatments / Results  Labs (all labs ordered are listed, but only abnormal results are displayed) Labs Reviewed  COMPREHENSIVE METABOLIC PANEL - Abnormal; Notable for the following:       Result Value   Potassium 3.4 (*)    CO2 21 (*)    Calcium 7.9 (*)    Total Protein 5.6 (*)    Albumin 3.3 (*)    All other components within normal limits  CBC - Abnormal; Notable for the following:    WBC 13.8 (*)  RBC 3.72 (*)    Hemoglobin 10.3 (*)    HCT 32.0 (*)    All other components within normal limits  URINALYSIS, ROUTINE W REFLEX MICROSCOPIC (NOT AT [facility]) - Abnormal; Notable for the following:    APPearance HAZY (*)    Hgb urine dipstick SMALL (*)    Leukocytes, UA MODERATE (*)    All other components within normal limits  LITHIUM LEVEL - Abnormal; Notable for the following:    Lithium Lvl <0.06 (*)    All other components within normal limits  URINE MICROSCOPIC-ADD ON - Abnormal; Notable for the following:    Squamous Epithelial / LPF 0-5 (*)    Bacteria, UA RARE (*)    All other  components within normal limits  LIPASE, BLOOD  I-STAT BETA HCG BLOOD, ED ([facility], [facility], [facility] ONLY)  I-STAT CG4 LACTIC ACID, ED  I-STAT CG4 LACTIC ACID, ED    EKG  EKG Interpretation  Date/Time:  Saturday November 03 2016 06:10:46 EST Ventricular Rate:  64 PR Interval:    QRS Duration: 92 QT Interval:  446 QTC Calculation: 461 R Axis:   87 Text Interpretation:  Sinus rhythm RSR' in V1 or V2, probably normal variant No significant change since last tracing Confirmed by Christy Gentles  MD, [place] ([id number]) on 11/03/2016 6:34:21 AM       Radiology Ct Abdomen Pelvis W Contrast  Result Date: 11/03/2016 CLINICAL DATA:  Right upper abdominal pain with nausea. Prior cholecystectomy. EXAM: CT ABDOMEN AND PELVIS WITH CONTRAST TECHNIQUE: Multidetector CT imaging of the abdomen and pelvis was performed using the standard protocol following bolus administration of intravenous contrast. CONTRAST:  122mL ISOVUE-300 IOPAMIDOL (ISOVUE-300) INJECTION 61% COMPARISON:  Plain films of earlier today. FINDINGS: Lower chest: Clear lung bases. Normal heart size without pericardial or pleural effusion. Hepatobiliary: Limited exam secondary to paucity of abdominal fat. Hepatomegaly at 18.9 cm craniocaudal Cholecystectomy. No intra or extrahepatic biliary duct dilatation. Common duct measures 8 mm. No choledocholithiasis. Pancreas: Normal, without mass or ductal dilatation. Spleen: A subcapsular 8 mm low-density splenic lesion is of doubtful clinical significance. Adrenals/Urinary Tract: Normal adrenal glands. Interpolar right renal 7 mm calcification on image 40/series 2 is likely a stone within a tiny caliceal diverticulum. No hydronephrosis. Normal urinary bladder. Stomach/Bowel: Normal stomach, without wall thickening. Normal colon. The appendix and terminal ileum are not confidently identified the appendix may be seen on image 62/series 2. Normal small bowel caliber. No free intraperitoneal air. Vascular/Lymphatic: Normal  caliber of the aorta and branch vessels. No well-defined abdominal adenopathy. No pelvic sidewall adenopathy. Reproductive: Anterior uterine fundal fibroid 5.1 cm. Suspect bilateral corpus luteal cysts, including on images 68-69/series 2. Prominent gonadal veins, especially on the left. Other: Small volume cul-de-sac fluid could be physiologic or relate to recent cyst/follicle rupture. Musculoskeletal: No acute osseous abnormality. IMPRESSION: 1. Slightly degraded exam, secondary to paucity of abdominal pelvic fat and lack of enteric contrast. 2. Cholecystectomy without biliary duct dilatation or definite explanation for right upper quadrant pain. 3. Lack of confident visualization of the appendix. 4. Dominant uterine fundal fibroid with probable bilateral corpus luteal cysts. Small volume fluid in the pelvis could be physiologic or relate to recent cyst rupture. 5. Right renal calcification is likely a stone within a caliceal diverticulum. No obstruction. Electronically Signed   By: Abigail Miyamoto M.D.   On: 11/03/2016 10:12   Dg Abdomen Acute W/chest  Result Date: 11/03/2016 CLINICAL DATA:  Abdominal and chest pain with guarding. EXAM: DG ABDOMEN ACUTE W/ 1V  CHEST COMPARISON:  03/11/2016 chest x-ray FINDINGS: EKG leads create artifact over the chest. Normal heart size and mediastinal contours. No acute infiltrate or edema. No effusion or pneumothorax. No acute osseous findings. Two borderline distended loops of small bowel in the left abdomen but no generalized obstructive changes. Formed stool throughout most colonic segments without rectal impaction. Cluster of calcifications over the right flank are likely renal, individually measuring up to 3 mm. When accounting for phleboliths there is no identified ureteral calculus. No pneumoperitoneum. IMPRESSION: 1. Prominent stool volume with nonobstructive bowel gas pattern. 2. Right flank calcifications, likely renal calculi. Electronically Signed   By: Monte Fantasia M.D.   On: 11/03/2016 07:56    Procedures Procedures (including critical care time)  Medications Ordered in ED Medications  fentaNYL (SUBLIMAZE) injection 50 mcg (50 mcg Intravenous Given 11/03/16 0637)  ondansetron (ZOFRAN) injection 4 mg (4 mg Intravenous Given 11/03/16 0637)  sodium chloride 0.9 % bolus 1,000 mL (0 mLs Intravenous Stopped 11/03/16 0902)  iopamidol (ISOVUE-300) 61 % injection (100 mLs  Contrast Given 11/03/16 0932)  cefTRIAXone (ROCEPHIN) 1 g in dextrose 5 % 50 mL IVPB (0 g Intravenous Stopped 11/03/16 1143)  gi cocktail (Maalox,Lidocaine,Donnatal) (30 mLs Oral Given 11/03/16 1143)     Initial Impression / Assessment and Plan / ED Course  I have reviewed the triage vital signs and the nursing notes.  Pertinent labs & imaging results that were available during my care of the patient were reviewed by me and considered in my medical decision making (see chart for details).  Clinical Course as of Nov 03 1309  Sat Nov 03, 2016  [id number] Reexamination of abdomen has improved after fentanyl.  No guarding.  Tenderness in epigastric region, upper abdomen.    [EW]  0845 Reexamination of the abdomen remains nonsurgical.  Tenderness throughout right abdomen, worst in epigastric/RUQ area.   [EW]  [id number] Pt reports feeling better.  Reexamination of the abdomen remains unchanged.  No lower abdominal tenderness.  Tenderness is epigastric, RUQ.  No guarding or rebound.   [EW]    Clinical Course User Index [EW] Clayton Bibles, PA-C   Afebrile nontoxic patient with acute onset abdominal pain with associated nausea that began just PTA.  Serial abdominal exams remained benign, suspect may be GERD/gastritis.  UA appears infected, will treat.  Doubt pyelonephritis.  Pt did not develop any additional symptoms.  Pressures soft though pt is very small and was given narcotics, sleeping during much of visit.  Workup otherwise reassuring.  No lower abdominal tenderness.  Doubt torsion.  Doubt  appendicitis.  Pt denies any vaginal symptoms.   Pt feeling better with treatment.  D/C home with PCP resources for follow up.  Discussed result, findings, treatment, and follow up  with patient.  Pt given return precautions.  Pt verbalizes understanding and agrees with plan.       Final Clinical Impressions(s) / ED Diagnoses   Final diagnoses:  Epigastric pain  Acute cystitis without hematuria    New Prescriptions Discharge Medication List as of 11/03/2016 11:20 AM    START taking these medications   Details  dicyclomine (BENTYL) 20 MG tablet Take 1 tablet (20 mg total) by mouth 2 (two) times daily as needed for spasms (abdominal pain)., Starting Sat 11/03/2016, Print    famotidine (PEPCID) 20 MG tablet Take 1 tablet (20 mg total) by mouth 2 (two) times daily., Starting Sat 11/03/2016, Print    sulfamethoxazole-trimethoprim (BACTRIM DS,SEPTRA DS) 800-160 MG tablet Take 1 tablet  by mouth 2 (two) times daily., Starting Sat 11/03/2016, Until Tue 11/06/2016, Print         [place], PA-C 11/03/16 1311    Ripley Fraise, MD 11/03/16 2308

## 2016-11-03 NOTE — ED Triage Notes (Signed)
Per EMS: Pt speaks Spanish only. Husband was translating. Abdominal pain started 45 minutes to 1 hour ago. Radiated to lower back. Pt was given 700 mL of NS for hypotension.  Pt is currently c/o N and generalized abdominal pain and tenderness.

## 2016-11-05 ENCOUNTER — Encounter (HOSPITAL_COMMUNITY): Payer: Self-pay | Admitting: Emergency Medicine

## 2017-02-06 ENCOUNTER — Encounter (HOSPITAL_COMMUNITY): Payer: Self-pay | Admitting: Emergency Medicine

## 2017-02-06 ENCOUNTER — Emergency Department (HOSPITAL_COMMUNITY)
Admission: EM | Admit: 2017-02-06 | Discharge: 2017-02-06 | Disposition: A | Discharge status: Home / Self Care | Payer: Medicaid Other | Attending: Emergency Medicine | Admitting: Emergency Medicine

## 2017-02-06 DIAGNOSIS — J111 Influenza due to unidentified influenza virus with other respiratory manifestations: Secondary | ICD-10-CM | POA: Insufficient documentation

## 2017-02-06 DIAGNOSIS — Z5321 Procedure and treatment not carried out due to patient leaving prior to being seen by health care provider: Secondary | ICD-10-CM | POA: Insufficient documentation

## 2017-02-06 LAB — BASIC METABOLIC PANEL
ANION GAP: 8 (ref 5–15)
BUN: 10 mg/dL (ref 6–20)
CHLORIDE: 106 mmol/L (ref 101–111)
CO2: 23 mmol/L (ref 22–32)
Calcium: 8.3 mg/dL — ABNORMAL LOW (ref 8.9–10.3)
Creatinine, Ser: 0.65 mg/dL (ref 0.44–1.00)
GFR calc Af Amer: >60 mL/min (ref >60)
GFR calc non Af Amer: >60 mL/min (ref >60)
GLUCOSE: 99 mg/dL (ref 65–99)
POTASSIUM: 3.7 mmol/L (ref 3.5–5.1)
Sodium: 137 mmol/L (ref 135–145)

## 2017-02-06 LAB — CBC WITH DIFFERENTIAL/PLATELET
BASOS ABS: 0 10*3/uL (ref 0.0–0.1)
Basophils Relative: 0 %
Eosinophils Absolute: 0 10*3/uL (ref 0.0–0.7)
Eosinophils Relative: 1 %
HEMATOCRIT: 35.7 % — AB (ref 36.0–46.0)
HEMOGLOBIN: 11.5 g/dL — AB (ref 12.0–15.0)
LYMPHS ABS: 1 10*3/uL (ref 0.7–4.0)
Lymphocytes Relative: 18 %
MCH: 27.6 pg (ref 26.0–34.0)
MCHC: 32.2 g/dL (ref 30.0–36.0)
MCV: 85.6 fL (ref 78.0–100.0)
Monocytes Absolute: 0.6 10*3/uL (ref 0.1–1.0)
Monocytes Relative: 12 %
NEUTROS ABS: 3.8 10*3/uL (ref 1.7–7.7)
Neutrophils Relative %: 69 %
Platelets: 208 10*3/uL (ref 150–400)
RBC: 4.17 MIL/uL (ref 3.87–5.11)
RDW: 15.9 % — ABNORMAL HIGH (ref 11.5–15.5)
WBC: 5.5 10*3/uL (ref 4.0–10.5)

## 2017-02-06 NOTE — ED Notes (Signed)
Patient called to room again and no answer.

## 2017-02-06 NOTE — ED Triage Notes (Signed)
Pt states she is been having generalized body ache, fever, chills nausea and vomiting for the past few days getting very weak.

## 2017-02-06 NOTE — ED Notes (Signed)
Patient called in triage to go back to room x 2 - no answer.

## 2017-11-27 ENCOUNTER — Other Ambulatory Visit: Payer: Self-pay

## 2017-11-27 ENCOUNTER — Encounter (HOSPITAL_COMMUNITY): Payer: Self-pay | Admitting: Emergency Medicine

## 2017-11-27 ENCOUNTER — Emergency Department (HOSPITAL_COMMUNITY)
Admission: EM | Admit: 2017-11-27 | Discharge: 2017-11-27 | Discharge status: Against Medical Advice | Payer: Self-pay | Attending: Emergency Medicine | Admitting: Emergency Medicine

## 2017-11-27 DIAGNOSIS — R109 Unspecified abdominal pain: Secondary | ICD-10-CM | POA: Insufficient documentation

## 2017-11-27 DIAGNOSIS — Z5321 Procedure and treatment not carried out due to patient leaving prior to being seen by health care provider: Secondary | ICD-10-CM | POA: Insufficient documentation

## 2017-11-27 LAB — COMPREHENSIVE METABOLIC PANEL
ALBUMIN: 3.8 g/dL (ref 3.5–5.0)
ALT: 13 U/L — ABNORMAL LOW (ref 14–54)
ANION GAP: 9 (ref 5–15)
AST: 15 U/L (ref 15–41)
Alkaline Phosphatase: 67 U/L (ref 38–126)
BILIRUBIN TOTAL: 0.6 mg/dL (ref 0.3–1.2)
BUN: 14 mg/dL (ref 6–20)
CO2: 21 mmol/L — AB (ref 22–32)
Calcium: 8.1 mg/dL — ABNORMAL LOW (ref 8.9–10.3)
Chloride: 106 mmol/L (ref 101–111)
Creatinine, Ser: 0.66 mg/dL (ref 0.44–1.00)
GFR calc Af Amer: >60 mL/min (ref >60)
GFR calc non Af Amer: >60 mL/min (ref >60)
GLUCOSE: 99 mg/dL (ref 65–99)
POTASSIUM: 3.1 mmol/L — AB (ref 3.5–5.1)
SODIUM: 136 mmol/L (ref 135–145)
TOTAL PROTEIN: 6.8 g/dL (ref 6.5–8.1)

## 2017-11-27 LAB — CBC
HEMATOCRIT: 35.6 % — AB (ref 36.0–46.0)
HEMOGLOBIN: 11.7 g/dL — AB (ref 12.0–15.0)
MCH: 27.7 pg (ref 26.0–34.0)
MCHC: 32.9 g/dL (ref 30.0–36.0)
MCV: 84.2 fL (ref 78.0–100.0)
PLATELETS: 260 10*3/uL (ref 150–400)
RBC: 4.23 MIL/uL (ref 3.87–5.11)
RDW: 15.1 % (ref 11.5–15.5)
WBC: 9.7 10*3/uL (ref 4.0–10.5)

## 2017-11-27 LAB — I-STAT BETA HCG BLOOD, ED (MC, WL, AP ONLY): HCG, QUANTITATIVE: 7.5 m[IU]/mL — AB (ref <5)

## 2017-11-27 LAB — LIPASE, BLOOD: Lipase: 26 U/L (ref 11–51)

## 2017-11-27 NOTE — ED Triage Notes (Signed)
Reports having LLQ pain for the last three months.  Reports pressure that recurs every month with period.  Endorses that period will come and goe 4-5 times a month.  Has not taken anything for pain.

## 2017-11-27 NOTE — ED Notes (Signed)
Pt called for vitals, no answer 

## 2017-11-27 NOTE — ED Notes (Signed)
Pt called twice with no answer. Pt not seen in lobby.

## 2017-11-28 ENCOUNTER — Telehealth: Payer: Self-pay | Admitting: Internal Medicine

## 2017-11-28 NOTE — Telephone Encounter (Signed)
Patient came into office for appointment and informed front desk that

## 2017-11-28 NOTE — Telephone Encounter (Signed)
Patient came into office for appointment and stated that she was affiliated with [facility].  Pat called [facility] to make an appointment.  [facility] informed Fraser Din that patient needs to call [facility] and ask for Mckinley Jewel at [phone].  Pat had Mr. Yvone Neu to call patient and relay the message.

## 2017-11-28 NOTE — Telephone Encounter (Signed)
noted 

## 2018-10-07 ENCOUNTER — Encounter: Payer: Self-pay | Admitting: Internal Medicine

## 2018-10-07 ENCOUNTER — Ambulatory Visit (INDEPENDENT_AMBULATORY_CARE_PROVIDER_SITE_OTHER): Payer: Self-pay | Admitting: Internal Medicine

## 2018-10-07 VITALS — BP 120/80 | HR 72 | Resp 12 | Ht 63.0 in | Wt 112.0 lb

## 2018-10-07 DIAGNOSIS — F310 Bipolar disorder, current episode hypomanic: Secondary | ICD-10-CM

## 2018-10-07 DIAGNOSIS — J45909 Unspecified asthma, uncomplicated: Secondary | ICD-10-CM | POA: Insufficient documentation

## 2018-10-07 DIAGNOSIS — Z79899 Other long term (current) drug therapy: Secondary | ICD-10-CM

## 2018-10-07 LAB — POCT URINALYSIS DIPSTICK
BILIRUBIN UA: NEGATIVE
Glucose, UA: NEGATIVE
Ketones, UA: NEGATIVE
Nitrite, UA: NEGATIVE
PROTEIN UA: NEGATIVE
Spec Grav, UA: 1.015 (ref 1.010–1.025)
UROBILINOGEN UA: 0.2 U/dL
pH, UA: 6.5 (ref 5.0–8.0)

## 2018-10-07 MED ORDER — QUETIAPINE FUMARATE 25 MG PO TABS: ORAL_TABLET | ORAL | 1 refills | Status: DC

## 2018-10-07 MED ORDER — HYDROXYZINE PAMOATE 25 MG PO CAPS: 25.0000 mg | ORAL_CAPSULE | Freq: Three times a day (TID) | ORAL | 1 refills | Status: DC | PRN

## 2018-10-07 MED ORDER — LITHIUM CARBONATE 300 MG PO TABS: ORAL_TABLET | ORAL | 1 refills | Status: DC

## 2018-10-07 NOTE — Progress Notes (Signed)
LCSW completed new patient screening with patient in order to assess for mental health concerns. LCSW was unable to complete SDOH questionnaire. Patient shared that she is bipolar and struggling with multiple serious mental health concerns. She endorsed depressed mood, anhedonia, insomnia, overeating, low self-esteem, restlessness, and suicidal thoughts. She stated that she frequently thinks things like "Why am I alive?" and "Why did my mother bring me into this world?" She shared that she sometimes imagines throwing herself off a bridge but denied having any intent to harm herself. Patient expressed concerns that she needs her psychotropic medications but that she has been off them for a while.  LCSW provided supportive listening and shared these concerns with Dr. Amil Amen.

## 2018-10-07 NOTE — Patient Instructions (Addendum)
Stop the Advil PM as interacts with lithium  You need to go to  [facility] and get set up with care there or go to [facility]
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
## 2018-10-08 LAB — CBC WITH DIFFERENTIAL/PLATELET
BASOS ABS: 0 10*3/uL (ref 0.0–0.2)
BASOS: 0 %
EOS (ABSOLUTE): 0.1 10*3/uL (ref 0.0–0.4)
Eos: 2 %
Hematocrit: 36.4 % (ref 34.0–46.6)
Hemoglobin: 11.9 g/dL (ref 11.1–15.9)
IMMATURE GRANS (ABS): 0 10*3/uL (ref 0.0–0.1)
IMMATURE GRANULOCYTES: 0 %
LYMPHS: 25 %
Lymphocytes Absolute: 1.9 10*3/uL (ref 0.7–3.1)
MCH: 28.7 pg (ref 26.6–33.0)
MCHC: 32.7 g/dL (ref 31.5–35.7)
MCV: 88 fL (ref 79–97)
MONOS ABS: 0.8 10*3/uL (ref 0.1–0.9)
Monocytes: 10 %
NEUTROS PCT: 63 %
Neutrophils Absolute: 4.7 10*3/uL (ref 1.4–7.0)
PLATELETS: 239 10*3/uL (ref 150–450)
RBC: 4.15 x10E6/uL (ref 3.77–5.28)
RDW: 13.3 % (ref 12.3–15.4)
WBC: 7.6 10*3/uL (ref 3.4–10.8)

## 2018-10-08 LAB — COMPREHENSIVE METABOLIC PANEL
A/G RATIO: 2.1 (ref 1.2–2.2)
ALT: 12 IU/L (ref 0–32)
AST: 13 IU/L (ref 0–40)
Albumin: 4.2 g/dL (ref 3.5–5.5)
Alkaline Phosphatase: 75 IU/L (ref 39–117)
BILIRUBIN TOTAL: 0.3 mg/dL (ref 0.0–1.2)
BUN/Creatinine Ratio: 20 (ref 9–23)
BUN: 13 mg/dL (ref 6–24)
CALCIUM: 9.1 mg/dL (ref 8.7–10.2)
CHLORIDE: 105 mmol/L (ref 96–106)
CO2: 22 mmol/L (ref 20–29)
Creatinine, Ser: 0.64 mg/dL (ref 0.57–1.00)
GFR calc Af Amer: 124 mL/min/{1.73_m2} (ref >59)
GFR, EST NON AFRICAN AMERICAN: 107 mL/min/{1.73_m2} (ref >59)
GLUCOSE: 94 mg/dL (ref 65–99)
Globulin, Total: 2 g/dL (ref 1.5–4.5)
POTASSIUM: 4.4 mmol/L (ref 3.5–5.2)
Sodium: 141 mmol/L (ref 134–144)
TOTAL PROTEIN: 6.2 g/dL (ref 6.0–8.5)

## 2018-10-08 LAB — TSH: TSH: 2 u[IU]/mL (ref 0.450–4.500)

## 2018-10-14 ENCOUNTER — Ambulatory Visit: Payer: Self-pay | Admitting: Internal Medicine

## 2018-11-14 ENCOUNTER — Emergency Department (HOSPITAL_COMMUNITY)
Admission: EM | Admit: 2018-11-14 | Discharge: 2018-11-14 | Disposition: A | Discharge status: Home / Self Care | Payer: Self-pay | Attending: Emergency Medicine | Admitting: Emergency Medicine

## 2018-11-14 ENCOUNTER — Encounter (HOSPITAL_COMMUNITY): Payer: Self-pay | Admitting: Emergency Medicine

## 2018-11-14 DIAGNOSIS — Y9301 Activity, walking, marching and hiking: Secondary | ICD-10-CM | POA: Insufficient documentation

## 2018-11-14 DIAGNOSIS — W540XXA Bitten by dog, initial encounter: Secondary | ICD-10-CM | POA: Insufficient documentation

## 2018-11-14 DIAGNOSIS — Z23 Encounter for immunization: Secondary | ICD-10-CM | POA: Insufficient documentation

## 2018-11-14 DIAGNOSIS — Z79899 Other long term (current) drug therapy: Secondary | ICD-10-CM | POA: Insufficient documentation

## 2018-11-14 DIAGNOSIS — Y999 Unspecified external cause status: Secondary | ICD-10-CM | POA: Insufficient documentation

## 2018-11-14 DIAGNOSIS — J45909 Unspecified asthma, uncomplicated: Secondary | ICD-10-CM | POA: Insufficient documentation

## 2018-11-14 DIAGNOSIS — S81831A Puncture wound without foreign body, right lower leg, initial encounter: Secondary | ICD-10-CM | POA: Insufficient documentation

## 2018-11-14 DIAGNOSIS — F17210 Nicotine dependence, cigarettes, uncomplicated: Secondary | ICD-10-CM | POA: Insufficient documentation

## 2018-11-14 DIAGNOSIS — Y929 Unspecified place or not applicable: Secondary | ICD-10-CM | POA: Insufficient documentation

## 2018-11-14 MED ORDER — MUPIROCIN CALCIUM 2 % EX CREA: 1.0000 "application " | TOPICAL_CREAM | Freq: Two times a day (BID) | CUTANEOUS | 0 refills | Status: DC

## 2018-11-14 MED ORDER — TETANUS-DIPHTH-ACELL PERTUSSIS 5-2.5-18.5 LF-MCG/0.5 IM SUSP
0.5000 mL | Freq: Once | INTRAMUSCULAR | Status: AC
  Administered 2018-11-14: 0.5 mL via INTRAMUSCULAR
  Filled 2018-11-14: qty 0.5

## 2018-11-14 MED ORDER — AMOXICILLIN-POT CLAVULANATE 875-125 MG PO TABS: 1.0000 | ORAL_TABLET | Freq: Two times a day (BID) | ORAL | 0 refills | Status: DC
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
## 2018-11-14 NOTE — Discharge Instructions (Addendum)
Gracias por permitirnos cuidar de usted en la sala [facility] [place]. Recommends mantener al perro en la casa durante 10 das y observarlo por cualquier signo de rabia. Gracias por permitirnos cuidar de usted en la sala de emergencias [place]. Le receto antibiticos para su herida de mordedura de perro.Si hay algn sntoma en el perro, debe volver a la sala de emergencias tan pronto como sea posible para recibir el tratamiento de la rabia o la profilaxis.

## 2018-11-14 NOTE — ED Triage Notes (Signed)
Pt speaks spanish. Reports she was bit by a pit bull to right calf. Two bite marks to the right calf. No allergies to meds. Wound cleaned upon arrival.

## 2018-11-18 ENCOUNTER — Emergency Department (HOSPITAL_COMMUNITY): Payer: Self-pay

## 2018-11-18 ENCOUNTER — Other Ambulatory Visit: Payer: Self-pay

## 2018-11-18 ENCOUNTER — Emergency Department (HOSPITAL_COMMUNITY)
Admission: EM | Admit: 2018-11-18 | Discharge: 2018-11-18 | Disposition: A | Discharge status: Home / Self Care | Payer: Self-pay | Attending: Emergency Medicine | Admitting: Emergency Medicine

## 2018-11-18 ENCOUNTER — Encounter (HOSPITAL_COMMUNITY): Payer: Self-pay | Admitting: Emergency Medicine

## 2018-11-18 DIAGNOSIS — R079 Chest pain, unspecified: Secondary | ICD-10-CM | POA: Insufficient documentation

## 2018-11-18 DIAGNOSIS — J45909 Unspecified asthma, uncomplicated: Secondary | ICD-10-CM | POA: Insufficient documentation

## 2018-11-18 DIAGNOSIS — F17210 Nicotine dependence, cigarettes, uncomplicated: Secondary | ICD-10-CM | POA: Insufficient documentation

## 2018-11-18 DIAGNOSIS — Z5189 Encounter for other specified aftercare: Secondary | ICD-10-CM

## 2018-11-18 LAB — CBC
HCT: 36.1 % (ref 36.0–46.0)
HEMOGLOBIN: 10.7 g/dL — AB (ref 12.0–15.0)
MCH: 26.7 pg (ref 26.0–34.0)
MCHC: 29.6 g/dL — ABNORMAL LOW (ref 30.0–36.0)
MCV: 90 fL (ref 80.0–100.0)
Platelets: 293 10*3/uL (ref 150–400)
RBC: 4.01 MIL/uL (ref 3.87–5.11)
RDW: 14.6 % (ref 11.5–15.5)
WBC: 10 10*3/uL (ref 4.0–10.5)
nRBC: 0 % (ref 0.0–0.2)

## 2018-11-18 LAB — I-STAT TROPONIN, ED
Troponin i, poc: 0 ng/mL (ref 0.00–0.08)
Troponin i, poc: 0 ng/mL (ref 0.00–0.08)

## 2018-11-18 LAB — BASIC METABOLIC PANEL
ANION GAP: 6 (ref 5–15)
BUN: 20 mg/dL (ref 6–20)
CO2: 26 mmol/L (ref 22–32)
Calcium: 8.5 mg/dL — ABNORMAL LOW (ref 8.9–10.3)
Chloride: 104 mmol/L (ref 98–111)
Creatinine, Ser: 0.6 mg/dL (ref 0.44–1.00)
GFR calc Af Amer: >60 mL/min (ref >60)
GFR calc non Af Amer: >60 mL/min (ref >60)
Glucose, Bld: 101 mg/dL — ABNORMAL HIGH (ref 70–99)
Potassium: 3.7 mmol/L (ref 3.5–5.1)
Sodium: 136 mmol/L (ref 135–145)

## 2018-11-18 LAB — D-DIMER, QUANTITATIVE (NOT AT ARMC): D DIMER QUANT: 0.62 ug{FEU}/mL — AB (ref 0.00–0.50)

## 2018-11-18 LAB — I-STAT BETA HCG BLOOD, ED (MC, WL, AP ONLY): I-stat hCG, quantitative: <5 m[IU]/mL (ref <5)

## 2018-11-18 MED ORDER — IOPAMIDOL (ISOVUE-370) INJECTION 76%
INTRAVENOUS | Status: AC
  Filled 2018-11-18: qty 100

## 2018-11-18 MED ORDER — IOPAMIDOL (ISOVUE-370) INJECTION 76%
100.0000 mL | Freq: Once | INTRAVENOUS | Status: AC | PRN
  Administered 2018-11-18: 63 mL via INTRAVENOUS

## 2018-11-18 MED ORDER — ALBUTEROL SULFATE (2.5 MG/3ML) 0.083% IN NEBU
2.5000 mg | INHALATION_SOLUTION | Freq: Once | RESPIRATORY_TRACT | Status: AC
  Administered 2018-11-18: 2.5 mg via RESPIRATORY_TRACT
  Filled 2018-11-18: qty 3

## 2018-11-18 NOTE — ED Notes (Signed)
Main lab called by this RN, per lab they have a blue top and will do an add on D-Dimer.

## 2018-11-18 NOTE — ED Provider Notes (Signed)
[facility] EMERGENCY DEPARTMENT Provider Note   CSN: [id number] Arrival date & time: 11/18/18  1602  History   Chief Complaint Chief Complaint  Patient presents with  . Chest Pain    HPI Gina Burton is a 47 y.o. female with past medical history significant for asthma, bipolar disorder who presents for evaluation of chest pain.  Patient states she has had chest pain x5 days.  Pain is intermittent in nature.  States pain is worse when she takes a deep breath.  Describes her pain as a sharp sensation.  Denies radiation of pain into jaw or extremity, diaphoresis, shortness of breath, hemoptysis, history of blood clots.  Pain is nonexertional in nature.  Denies history of cardiac work-up. Denies history of hypertension, hypercholesterolemia, diabetes, family history of cardiac disorders.  Rates her pain a 4/10 when it occurs.  She does not have any current pain at this time.  Patient like reevaluation for animal bite.  Patient states she was bit by a dog to the right lower calf on 11/14/2018.  Patient states she was given Augmentin at that time.  Patient states she has taken this without missing doses.  Patient states she does have 2 days remaining on her antibiotic.  Denies lower extremity pain, swelling, redness or warmth.  Admits to ecchymosis surrounding animal bite.  Patient states occasionally this area will itch, however she applies ice to this area she has relief of symptoms.  Denies recent surgeries, unilateral leg swelling, recent immobilization.  Patient did states she was previously on exogenous hormone use which she stopped using approximately 3 weeks ago. Denies additional alleviating or aggrivating symptoms otherwise stated in the HPI.  History of pain from patient and significant other.  Medical interpreter was used during visit.  HPI  Past Medical History:  Diagnosis Date  . Asthma   . Bipolar disorder, current episode hypomanic [facility])      Patient Active Problem List   Diagnosis Date Noted  . Bipolar disorder, current episode hypomanic ([place])   . Asthma     Past Surgical History:  Procedure Laterality Date  . arm surgery    . CHOLECYSTECTOMY  1997     OB History   None      Home Medications    Prior to Admission medications   Medication Sig Start Date End Date Taking? Authorizing Provider  amoxicillin-clavulanate (AUGMENTIN) 875-125 MG tablet Take 1 tablet by mouth 2 (two) times daily. 11/14/18   Masoudi, Dorthula Rue, MD  hydrOXYzine (VISTARIL) 25 MG capsule Take 1 capsule (25 mg total) by mouth every 8 (eight) hours as needed. 10/07/18   Mack Hook, MD  lithium 300 MG tablet 1 tab by mouth twice daily. 10/07/18   Mack Hook, MD  mupirocin cream (BACTROBAN) 2 % Apply 1 application topically 2 (two) times daily. 11/14/18   Masoudi, Dorthula Rue, MD  QUEtiapine (SEROQUEL) 25 MG tablet 1 tab by mouth at bedtime 10/07/18   Mack Hook, MD    Family History Family History  Problem Relation Age of Onset  . Cancer Mother        Possibly Esophageal  . Asthma Mother   . COPD Mother        smoker  . Bipolar disorder Mother        Possibly as well.  Not clear if diagnosed  . Seizures Sister     Social History Social History   Tobacco Use  . Smoking status: Current Every Day Smoker  Packs/day: 0.50    Types: Cigarettes  . Smokeless tobacco: Never Used  Substance Use Topics  . Alcohol use: No  . Drug use: No     Allergies   Patient has no known allergies.   Review of Systems Review of Systems  Constitutional: Negative.   HENT: Negative.   Respiratory: Negative.   Cardiovascular: Positive for chest pain. Negative for palpitations and leg swelling.  Gastrointestinal: Negative.   Genitourinary: Negative.   Musculoskeletal: Negative.   Skin: Positive for wound.  Neurological: Negative.   All other systems reviewed and are negative.    Physical Exam Updated  Vital Signs BP 107/82 (BP Location: Right Arm)   Pulse 89   Temp 98.5 F (36.9 C) (Oral)   Resp 18   SpO2 100%   Physical Exam  Constitutional: She appears well-developed and well-nourished.  Non-toxic appearance. She does not appear ill. No distress.  HENT:  Head: Atraumatic.  Eyes: Pupils are equal, round, and reactive to light.  Neck: Normal range of motion.  Cardiovascular: Normal rate, intact distal pulses and normal pulses.  Pulmonary/Chest: Effort normal. No accessory muscle usage or stridor. No tachypnea. No respiratory distress. She has no decreased breath sounds. She has wheezes. She has no rhonchi. She has no rales.  Mild diffuse expiratory wheezes.  No rales or rhonchi.  Abdominal: She exhibits no distension.  Musculoskeletal: Normal range of motion.  Neurological: She is alert.  Skin: Skin is warm and dry.  Well-healing wound to posterior right calf.  Wound with surrounding ecchymosis.  No warmth, edema or erythema surrounding wound.  See image.  Psychiatric: She has a normal mood and affect.  Nursing note and vitals reviewed.      ED Treatments / Results  Labs (all labs ordered are listed, but only abnormal results are displayed) Labs Reviewed  BASIC METABOLIC PANEL - Abnormal; Notable for the following components:      Result Value   Glucose, Bld 101 (*)    Calcium 8.5 (*)    All other components within normal limits  CBC - Abnormal; Notable for the following components:   Hemoglobin 10.7 (*)    MCHC 29.6 (*)    All other components within normal limits  D-DIMER, QUANTITATIVE (NOT AT [facility]) - Abnormal; Notable for the following components:   D-Dimer, Quant 0.62 (*)    All other components within normal limits  I-STAT TROPONIN, ED  I-STAT BETA HCG BLOOD, ED ([facility], [facility], [facility] ONLY)  I-STAT TROPONIN, ED    EKG None  Radiology Dg Chest 2 View  Result Date: 11/18/2018 CLINICAL DATA:  Chest pain EXAM: CHEST - 2 VIEW COMPARISON:  03/11/2016 FINDINGS: Heart and  mediastinal contours are within normal limits. No focal opacities or effusions. No acute bony abnormality. IMPRESSION: No active cardiopulmonary disease. Electronically Signed   By: Rolm Baptise M.D.   On: 11/18/2018 19:04   Ct Angio Chest Pe W/cm &/or Wo Cm  Result Date: 11/18/2018 CLINICAL DATA:  Midsternal chest pain and lower extremity pain. Positive D-dimer. Suspected pulmonary embolism. EXAM: CT ANGIOGRAPHY CHEST WITH CONTRAST TECHNIQUE: Multidetector CT imaging of the chest was performed using the standard protocol during bolus administration of intravenous contrast. Multiplanar CT image reconstructions and MIPs were obtained to evaluate the vascular anatomy. CONTRAST:  9mL ISOVUE-370 IOPAMIDOL (ISOVUE-370) INJECTION 76% COMPARISON:  None. FINDINGS: Cardiovascular: Satisfactory opacification of pulmonary arteries noted, and no pulmonary emboli identified. No evidence of thoracic aortic dissection or aneurysm. Mediastinum/Nodes: No masses or pathologically enlarged  lymph nodes identified. Lungs/Pleura: No pulmonary mass, infiltrate, or effusion. Upper abdomen: No acute findings. Musculoskeletal: No suspicious bone lesions identified. Review of the MIP images confirms the above findings. IMPRESSION: Negative. No evidence of pulmonary embolism or other active disease within the thorax. Electronically Signed   By: Earle Gell M.D.   On: 11/18/2018 20:44    Procedures Procedures (including critical care time)  Medications Ordered in ED Medications  iopamidol (ISOVUE-370) 76 % injection (has no administration in time range)  albuterol (PROVENTIL) (2.5 MG/3ML) 0.083% nebulizer solution 2.5 mg (2.5 mg Nebulization Given 11/18/18 1922)  iopamidol (ISOVUE-370) 76 % injection 100 mL (63 mLs Intravenous Contrast Given 11/18/18 2021)     Initial Impression / Assessment and Plan / ED Course  I have reviewed the triage vital signs and the nursing notes.  Pertinent labs & imaging results that were  available during my care of the patient were reviewed by me and considered in my medical decision making (see chart for details).  47 year old female presents for evaluation of intermittent chest pain for 5 days.  Pain nonexertional in nature.  Pain worse when taking deep breath.  Does have history of asthma, however does not have albuterol inhaler at home.  States she has had issues with wheezing and shortness of breath over the last "few months."  Denies associated diaphoresis, weakness, radiation of pain.  No history of diabetes, hypertension, hypercholesterolemia, family history of cardiac disorders.   Heart score low risk, PERC low risk, will obtain labs, EKG, imaging and reevaluate.  Wound to right posterior calf without evidence of infection.  Discussed continuing her Augmentin until course completed.  She does have mild diffuse expiratory wheeze with history of asthma.  Will give albuterol treatment.  EKG is not transferring over into the system. Dr. Sedonia Small my attending physician has reviewed EKG. EKG without evidence of acute ischemia.    Reevaluation patient with lungs clear to auscultation after albuterol treatment.  No evidence of respiratory distress.  Oxygen saturation 100% on room air with good waveform.  CBC without leukocytosis, hemoglobin 10.7, at patient's baseline, metabolic panel without evidence of electrolyte abnormality, normal renal and liver function.  Delta troponin negative, d-dimer 0.62. Chest xray negative for infiltrates, CHF, pulmonary edema, pneumothorax,, CT A PE study negative for PE.  Patient is to be discharged with recommendation to follow up with PCP in regards to today's hospital visit. Chest pain is not likely of cardiac or pulmonary etiology d/t presentation, VSS, no tracheal deviation, no JVD or new murmur, RRR, breath sounds equal bilaterally, EKG without acute abnormalities, negative troponin, and negative CXR. Pt has been advised to return to the ED if CP becomes  exertional, associated with diaphoresis or nausea, radiates to left jaw/arm, worsens or becomes concerning in any way.  Advised patient to continued antibiotic use until therapy completed for dog bite. Patient hemodynamically stable and appropriate for dc home at this time. Pt appears reliable for follow up and is agreeable to discharge.    Final Clinical Impressions(s) / ED Diagnoses   Final diagnoses:  Chest pain, unspecified type  Encounter for wound re-check    ED Discharge Orders    None       Annely Sliva A, PA-C 11/18/18 2207    Maudie Flakes, MD 11/19/18 1054

## 2018-11-18 NOTE — ED Notes (Signed)
EKG done and given to EDP, Bero. EKG is not transmitting over to file.

## 2018-11-18 NOTE — ED Notes (Signed)
Pt ambulatory to bathroom

## 2018-11-18 NOTE — Discharge Instructions (Addendum)
Your evaluated today for chest pain.  Your work-up was negative.  I would suggest following up with a primary care provider which we have referred you to, [facility].  Continue taking the antibiotics which you are prescribed for your animal bite.  If this area becomes red, warm please seek reevaluation.  Return to the ED for any new or worsening symptoms

## 2018-11-18 NOTE — ED Notes (Signed)
Pa Sanders stated that another EKG not needed.

## 2018-11-18 NOTE — ED Triage Notes (Signed)
Pt speaks spanish and triage was provided via translator pad. Pt reports that she was seen for a dog bite but states that she is here now with midsternal chest pain that feels like a sting. She also reports pain to her leg as well.

## 2018-11-18 NOTE — ED Notes (Signed)
Discharge instructions discussed with Pt. Pt verbalized understanding. Pt stable and ambulatory.    

## 2018-11-18 NOTE — ED Notes (Signed)
Pt back from CT

## 2018-11-18 NOTE — ED Notes (Signed)
Patient transported to X-ray 

## 2018-11-18 NOTE — ED Notes (Signed)
Patient transported to CT 

## 2019-01-08 ENCOUNTER — Emergency Department (HOSPITAL_COMMUNITY)
Admission: EM | Admit: 2019-01-08 | Discharge: 2019-01-08 | Disposition: A | Discharge status: Home / Self Care | Payer: Self-pay | Attending: Emergency Medicine | Admitting: Emergency Medicine

## 2019-01-08 ENCOUNTER — Emergency Department (HOSPITAL_COMMUNITY): Payer: Self-pay

## 2019-01-08 ENCOUNTER — Emergency Department (HOSPITAL_BASED_OUTPATIENT_CLINIC_OR_DEPARTMENT_OTHER): Payer: Self-pay

## 2019-01-08 DIAGNOSIS — J45909 Unspecified asthma, uncomplicated: Secondary | ICD-10-CM | POA: Insufficient documentation

## 2019-01-08 DIAGNOSIS — Z79899 Other long term (current) drug therapy: Secondary | ICD-10-CM | POA: Insufficient documentation

## 2019-01-08 DIAGNOSIS — M79604 Pain in right leg: Secondary | ICD-10-CM

## 2019-01-08 DIAGNOSIS — M79609 Pain in unspecified limb: Secondary | ICD-10-CM

## 2019-01-08 DIAGNOSIS — F17210 Nicotine dependence, cigarettes, uncomplicated: Secondary | ICD-10-CM | POA: Insufficient documentation

## 2019-01-08 MED ORDER — MELOXICAM 7.5 MG PO TABS: 7.5000 mg | ORAL_TABLET | Freq: Every day | ORAL | 0 refills | Status: AC

## 2019-01-08 NOTE — ED Notes (Signed)
Pt in Vascular at this time.

## 2019-01-08 NOTE — ED Notes (Signed)
Pt back from X-ray.  

## 2019-01-08 NOTE — ED Triage Notes (Signed)
Per interpreter pt complains of dog bite to lower right leg back in november and was given antibiotics, pt states that she is having pain to right lower leg x 3 weeks. Pt has old bite marks, no sign of obvious infection now. VSS.

## 2019-01-08 NOTE — Progress Notes (Signed)
VASCULAR LAB PRELIMINARY  PRELIMINARY  PRELIMINARY  PRELIMINARY  Right lower extremity venous duplex completed.    Preliminary report:  See CV Proc  Gave report to Suella Broad, PA-C  [facility], Tanica Gaige, RVT 01/08/2019, 11:30 AM

## 2019-01-08 NOTE — Discharge Instructions (Addendum)
Follow up with your doctor. Use Crutches as needed. Take Meloxicam as prescribed for pain.  Haz un seguimiento con tu mdico. Use muletas segn sea necesario. Tome Meloxicam segn sea necesario segn lo prescrito para Conservation officer, historic buildings.

## 2019-01-08 NOTE — ED Provider Notes (Signed)
[facility] EMERGENCY DEPARTMENT Provider Note   CSN: [id number] Arrival date & time: 01/08/19  8366     History   Chief Complaint No chief complaint on file.   HPI Gina Burton is a 48 y.o. female.  48 year old female presents with complaint of pain in her right calf and right lower leg.  Patient states that she was bitten by a dog on her right calf in November, seen at this ER at that time and given antibiotics which she completed.  Patient states pain got a little bit better however has gotten progressively worse over the past 3 weeks reports pain with trying to put her leg down or walk on her foot.  Also has pain to the dorsum of her right foot.  Denies any new injuries. History obtained using hospital translator.     Past Medical History:  Diagnosis Date  . Asthma   . Bipolar disorder, current episode hypomanic [facility])     Patient Active Problem List   Diagnosis Date Noted  . Bipolar disorder, current episode hypomanic ([place])   . Asthma     Past Surgical History:  Procedure Laterality Date  . arm surgery    . CHOLECYSTECTOMY  1997     OB History   No obstetric history on file.      Home Medications    Prior to Admission medications   Medication Sig Start Date End Date Taking? Authorizing Provider  amoxicillin-clavulanate (AUGMENTIN) 875-125 MG tablet Take 1 tablet by mouth 2 (two) times daily. 11/14/18   Masoudi, Dorthula Rue, MD  hydrOXYzine (VISTARIL) 25 MG capsule Take 1 capsule (25 mg total) by mouth every 8 (eight) hours as needed. 10/07/18   Mack Hook, MD  lithium 300 MG tablet 1 tab by mouth twice daily. 10/07/18   Mack Hook, MD  meloxicam (MOBIC) 7.5 MG tablet Take 1 tablet (7.5 mg total) by mouth daily for 10 days. 01/08/19 01/18/19  Tacy Learn, PA-C  mupirocin cream (BACTROBAN) 2 % Apply 1 application topically 2 (two) times daily. 11/14/18   Masoudi, Dorthula Rue, MD  QUEtiapine (SEROQUEL) 25  MG tablet 1 tab by mouth at bedtime 10/07/18   Mack Hook, MD    Family History Family History  Problem Relation Age of Onset  . Cancer Mother        Possibly Esophageal  . Asthma Mother   . COPD Mother        smoker  . Bipolar disorder Mother        Possibly as well.  Not clear if diagnosed  . Seizures Sister     Social History Social History   Tobacco Use  . Smoking status: Current Every Day Smoker    Packs/day: 0.50    Types: Cigarettes  . Smokeless tobacco: Never Used  Substance Use Topics  . Alcohol use: No  . Drug use: No     Allergies   Patient has no known allergies.   Review of Systems Review of Systems  Constitutional: Negative for chills and fever.  Musculoskeletal: Positive for gait problem and myalgias. Negative for arthralgias and joint swelling.  Skin: Positive for wound. Negative for rash.  Allergic/Immunologic: Negative for immunocompromised state.  Neurological: Negative for weakness and numbness.  Hematological: Negative for adenopathy. Does not bruise/bleed easily.  Psychiatric/Behavioral: Negative for confusion.  All other systems reviewed and are negative.    Physical Exam Updated Vital Signs BP (!) 143/75 (BP Location: Right Arm)   Pulse 83  Temp 98.1 F (36.7 C) (Oral)   Resp 18   SpO2 100%   Physical Exam Vitals signs and nursing note reviewed.  Constitutional:      General: She is not in acute distress.    Appearance: She is well-developed. She is not diaphoretic.  HENT:     Head: Normocephalic and atraumatic.  Cardiovascular:     Pulses: Normal pulses.  Pulmonary:     Effort: Pulmonary effort is normal.  Musculoskeletal:        General: Swelling and tenderness present.     Right knee: No tenderness found.       Legs:     Right foot: Normal range of motion and normal capillary refill. Tenderness present.       Feet:  Skin:    General: Skin is warm and dry.     Findings: No erythema or rash.    Neurological:     Mental Status: She is alert and oriented to person, place, and time.  Psychiatric:        Behavior: Behavior normal.      ED Treatments / Results  Labs (all labs ordered are listed, but only abnormal results are displayed) Labs Reviewed - No data to display  EKG None  Radiology Dg Tibia/fibula Right  Result Date: 01/08/2019 CLINICAL DATA:  Dog bite to the right leg in November. Posterior leg pain for 3 weeks. EXAM: RIGHT TIBIA AND FIBULA - 2 VIEW COMPARISON:  None. FINDINGS: Equivocal for subcutaneous fat reticulation at the level of the mid calf. There is no opaque foreign body, soft tissue gas, or osseous abnormality. IMPRESSION: Negative. Electronically Signed   By: Monte Fantasia M.D.   On: 01/08/2019 11:56   Vas [place] Lower Extremity Venous (dvt) (only [facility])  Result Date: 01/08/2019  Lower Venous Study Indications: Pain, and Dog bite in November.  Comparison Study: No prior study on file Performing Technologist: Sharion Dove RVS  Examination Guidelines: A complete evaluation includes B-mode imaging, spectral Doppler, color Doppler, and power Doppler as needed of all accessible portions of each vessel. Bilateral testing is considered an integral part of a complete examination. Limited examinations for reoccurring indications may be performed as noted.  Right Venous Findings: +---------+---------------+---------+-----------+----------+-------+          CompressibilityPhasicitySpontaneityPropertiesSummary +---------+---------------+---------+-----------+----------+-------+ CFV      Full           Yes      Yes                          +---------+---------------+---------+-----------+----------+-------+ SFJ      Full                                                 +---------+---------------+---------+-----------+----------+-------+ FV Prox  Full                                                  +---------+---------------+---------+-----------+----------+-------+ FV Mid   Full                                                 +---------+---------------+---------+-----------+----------+-------+  FV DistalFull                                                 +---------+---------------+---------+-----------+----------+-------+ PFV      Full                                                 +---------+---------------+---------+-----------+----------+-------+ POP      Full           Yes      Yes                          +---------+---------------+---------+-----------+----------+-------+ PTV      Full                                                 +---------+---------------+---------+-----------+----------+-------+ PERO     Full                                                 +---------+---------------+---------+-----------+----------+-------+ GSV      Full                                                 +---------+---------------+---------+-----------+----------+-------+  Left Venous Findings: +---+---------------+---------+-----------+----------+-------+    CompressibilityPhasicitySpontaneityPropertiesSummary +---+---------------+---------+-----------+----------+-------+ CFVFull           Yes      Yes                          +---+---------------+---------+-----------+----------+-------+    Summary: Right: There is no evidence of deep vein thrombosis in the lower extremity.There is no evidence of superficial venous thrombosis. Ultrasound characteristics of enlarged lymph nodes are noted in the groin. Left: No evidence of common femoral vein obstruction.  *See table(s) above for measurements and observations.    Preliminary     Procedures Procedures (including critical care time)  Medications Ordered in ED Medications - No data to display   Initial Impression / Assessment and Plan / ED Course  I have reviewed the triage vital signs and the  nursing notes.  Pertinent labs & imaging results that were available during my care of the patient were reviewed by me and considered in my medical decision making (see chart for details).  Clinical Course as of Jan 08 1237  Thu Jan 08, 6521  5544 48 year old female with pain in the right calf x3 weeks however states she is had pain since a dog bite to the same leg in November.  Review of record at that time, x-ray was not completed to rule out foreign body.  X-ray today does not show retained foreign body.  Venous Doppler obtained to evaluate for DVT due to complaint of calf pain, negative for DVT.  Patient was given course of meloxicam and crutches  to weight-bear as tolerated.  Advised to follow-up with her PCP.   [LM]    Clinical Course User Index [LM] Tacy Learn, PA-C   Final Clinical Impressions(s) / ED Diagnoses   Final diagnoses:  Right leg pain    ED Discharge Orders         Ordered    meloxicam (MOBIC) 7.5 MG tablet  Daily     01/08/19 1231           Roque Lias 01/08/19 1238    Maudie Flakes, MD 01/09/19 [phone]

## 2019-01-08 NOTE — ED Notes (Signed)
Pt back from Vascular.

## 2019-01-08 NOTE — ED Notes (Signed)
Patient transported to X-ray 

## 2019-01-10 IMAGING — CR DG CHEST 2V
2 series · 2 of 2 positions shown · non-contrast
Comparison: 03/11/2016

CLINICAL DATA: Chest pain

EXAM:
CHEST - 2 VIEW

[chest pa]
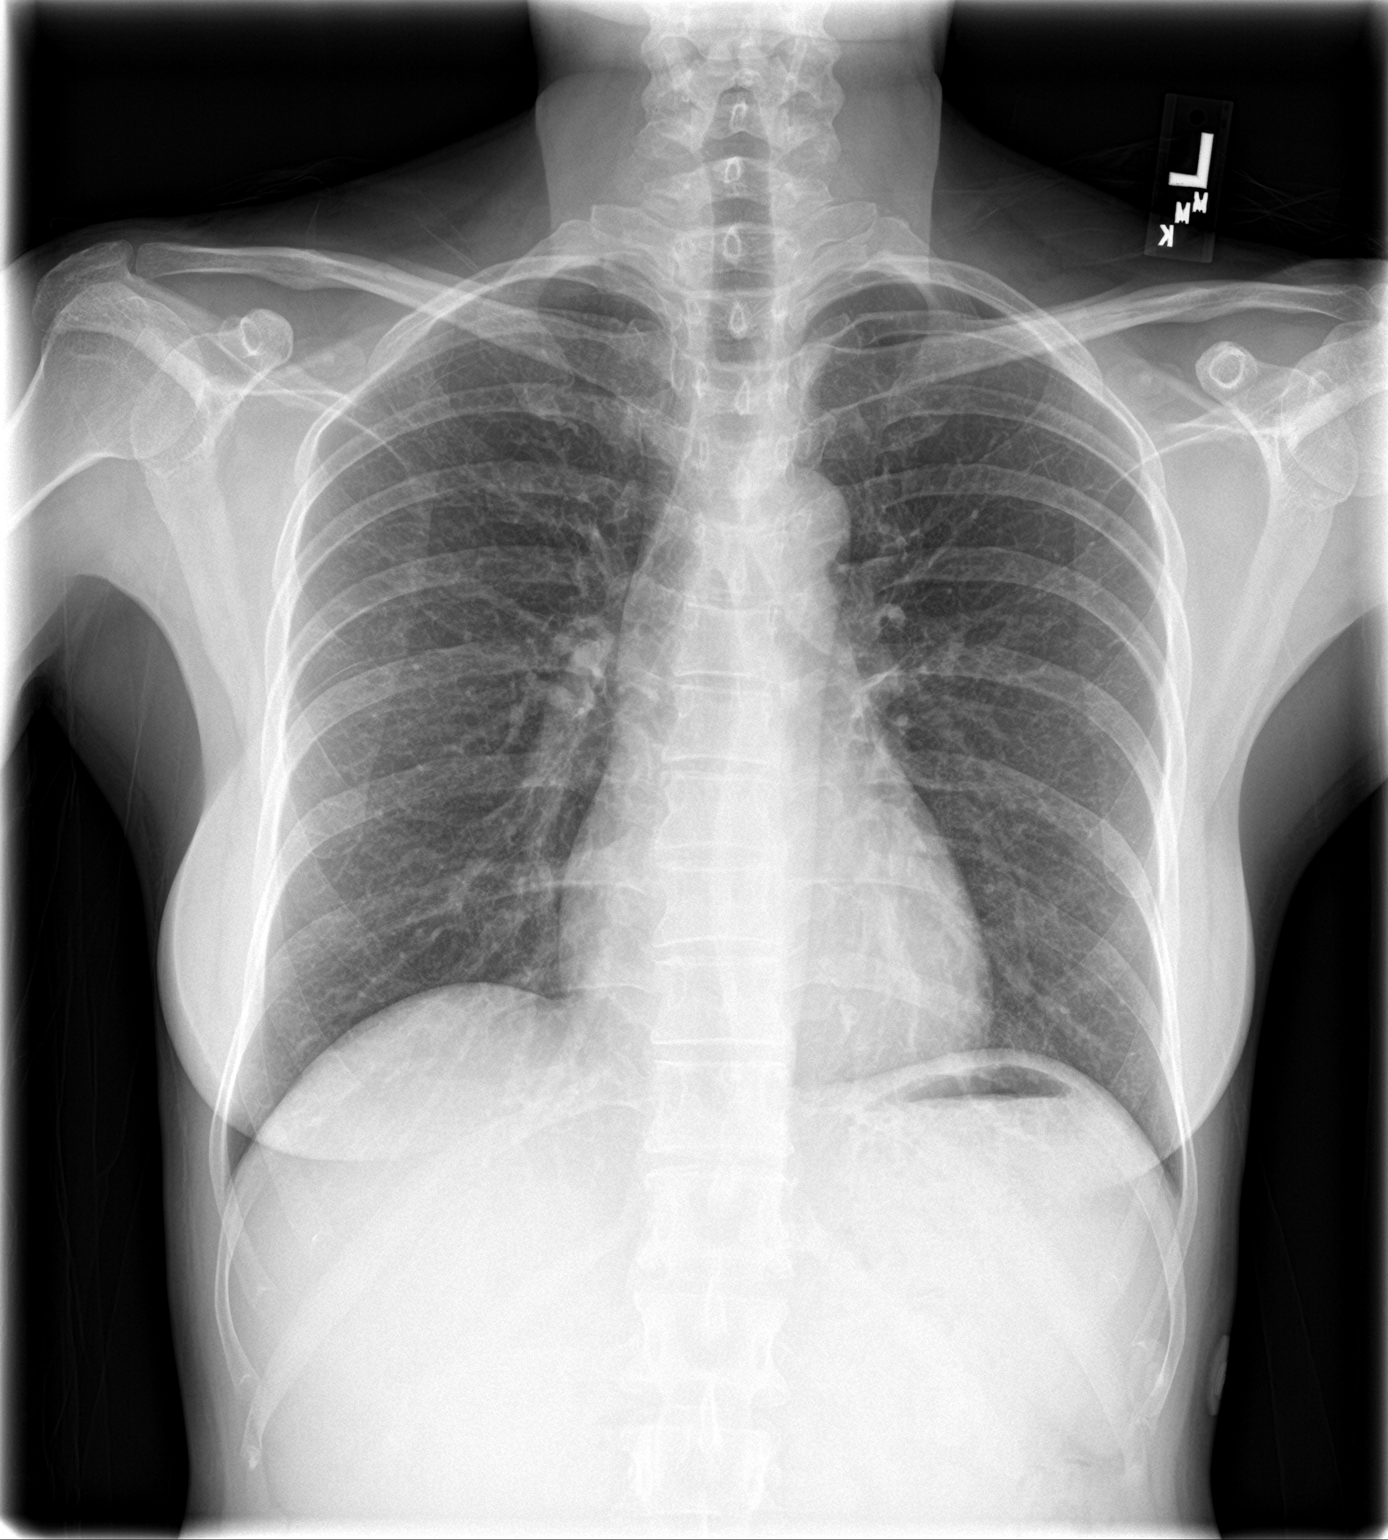

[chest lat]
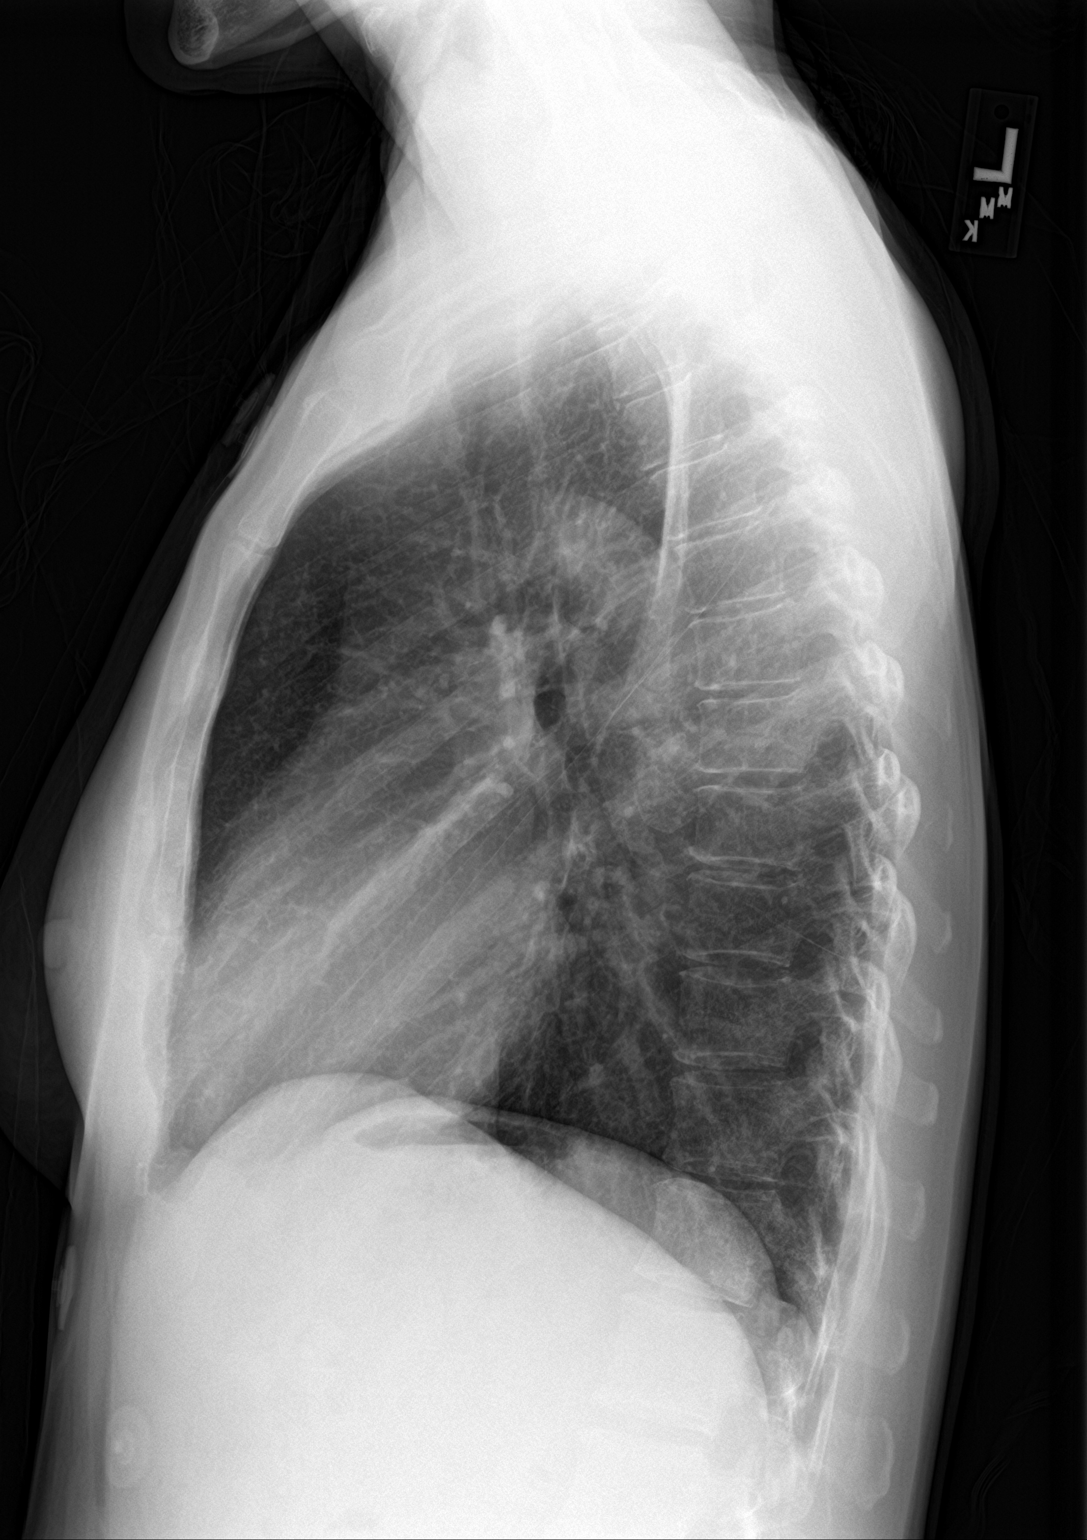

[2 of 2 positions shown; findings below may reference images not displayed]

FINDINGS: Heart and mediastinal contours are within normal limits. No focal
opacities or effusions. No acute bony abnormality.
IMPRESSION: No active cardiopulmonary disease.

## 2019-06-28 ENCOUNTER — Other Ambulatory Visit: Payer: Self-pay

## 2019-06-28 ENCOUNTER — Emergency Department (HOSPITAL_COMMUNITY): Payer: Self-pay

## 2019-06-28 ENCOUNTER — Emergency Department (HOSPITAL_COMMUNITY)
Admission: EM | Admit: 2019-06-28 | Discharge: 2019-06-28 | Disposition: A | Discharge status: Home / Self Care | Payer: Self-pay | Attending: Emergency Medicine | Admitting: Emergency Medicine

## 2019-06-28 ENCOUNTER — Encounter (HOSPITAL_COMMUNITY): Payer: Self-pay

## 2019-06-28 DIAGNOSIS — Y999 Unspecified external cause status: Secondary | ICD-10-CM | POA: Insufficient documentation

## 2019-06-28 DIAGNOSIS — J45909 Unspecified asthma, uncomplicated: Secondary | ICD-10-CM | POA: Insufficient documentation

## 2019-06-28 DIAGNOSIS — Y9301 Activity, walking, marching and hiking: Secondary | ICD-10-CM | POA: Insufficient documentation

## 2019-06-28 DIAGNOSIS — F17210 Nicotine dependence, cigarettes, uncomplicated: Secondary | ICD-10-CM | POA: Insufficient documentation

## 2019-06-28 DIAGNOSIS — Y929 Unspecified place or not applicable: Secondary | ICD-10-CM | POA: Insufficient documentation

## 2019-06-28 DIAGNOSIS — W109XXA Fall (on) (from) unspecified stairs and steps, initial encounter: Secondary | ICD-10-CM | POA: Insufficient documentation

## 2019-06-28 DIAGNOSIS — Z79899 Other long term (current) drug therapy: Secondary | ICD-10-CM | POA: Insufficient documentation

## 2019-06-28 DIAGNOSIS — S93401A Sprain of unspecified ligament of right ankle, initial encounter: Secondary | ICD-10-CM | POA: Insufficient documentation

## 2019-06-28 MED ORDER — ACETAMINOPHEN 325 MG PO TABS
650.0000 mg | ORAL_TABLET | Freq: Once | ORAL | Status: AC
  Administered 2019-06-28: 22:00:00 650 mg via ORAL
  Filled 2019-06-28: qty 2

## 2019-06-28 NOTE — ED Provider Notes (Signed)
[facility] EMERGENCY DEPARTMENT Provider Note   CSN: [id number] Arrival date & time: 06/28/19  2012    History   Chief Complaint Chief Complaint  Patient presents with  . Foot Injury    HPI Gina Burton is a 48 y.o. female past medical history of asthma presents emergency department today with chief complaint of right ankle pain.  Onset was acute happening just prior to arrival.  Patient states she was walking down her outdoor stairs when she tripped causing her to fall down 3 stairs.  She landed on her knees.  She is reporting pain in the top of her right foot.  It is sharp and constant.  She rates it 10 out of 10 in severity.  She did not take anything for pain prior to arrival.  She has not been able to bear weight since the fall.  She denies fever, chills, numbness, tingling, head injury, LOC, neck pain.  Due to language barrier, a video interpreter was present during the history-taking and subsequent discussion (and for part of the physical exam) with this patient.     Past Medical History:  Diagnosis Date  . Asthma   . Bipolar disorder, current episode hypomanic [facility])     Patient Active Problem List   Diagnosis Date Noted  . Bipolar disorder, current episode hypomanic ([place])   . Asthma     Past Surgical History:  Procedure Laterality Date  . arm surgery    . CHOLECYSTECTOMY  1997     OB History   No obstetric history on file.      Home Medications    Prior to Admission medications   Medication Sig Start Date End Date Taking? Authorizing Provider  amoxicillin-clavulanate (AUGMENTIN) 875-125 MG tablet Take 1 tablet by mouth 2 (two) times daily. 11/14/18   Masoudi, Dorthula Rue, MD  hydrOXYzine (VISTARIL) 25 MG capsule Take 1 capsule (25 mg total) by mouth every 8 (eight) hours as needed. 10/07/18   Mack Hook, MD  lithium 300 MG tablet 1 tab by mouth twice daily. 10/07/18   Mack Hook, MD  mupirocin cream  (BACTROBAN) 2 % Apply 1 application topically 2 (two) times daily. 11/14/18   Masoudi, Dorthula Rue, MD  QUEtiapine (SEROQUEL) 25 MG tablet 1 tab by mouth at bedtime 10/07/18   Mack Hook, MD    Family History Family History  Problem Relation Age of Onset  . Cancer Mother        Possibly Esophageal  . Asthma Mother   . COPD Mother        smoker  . Bipolar disorder Mother        Possibly as well.  Not clear if diagnosed  . Seizures Sister     Social History Social History   Tobacco Use  . Smoking status: Current Every Day Smoker    Packs/day: 0.50    Types: Cigarettes  . Smokeless tobacco: Never Used  Substance Use Topics  . Alcohol use: No  . Drug use: No     Allergies   Patient has no known allergies.   Review of Systems Review of Systems  Constitutional: Negative for chills and fever.  Musculoskeletal: Positive for arthralgias. Negative for back pain, joint swelling, neck pain and neck stiffness.  Skin: Negative for wound.  Neurological: Negative for weakness and numbness.     Physical Exam Updated Vital Signs BP 112/67   Pulse 70   Temp 98.1 F (36.7 C) (Oral)   Resp 19  SpO2 100%   Physical Exam Vitals signs and nursing note reviewed.  Constitutional:      Appearance: She is well-developed. She is not ill-appearing or toxic-appearing.  HENT:     Head: Normocephalic and atraumatic.     Nose: Nose normal.  Eyes:     General: No scleral icterus.       Right eye: No discharge.        Left eye: No discharge.     Conjunctiva/sclera: Conjunctivae normal.  Neck:     Musculoskeletal: Normal range of motion.     Vascular: No JVD.  Cardiovascular:     Rate and Rhythm: Normal rate and regular rhythm.     Pulses: Normal pulses.          Radial pulses are 2+ on the right side and 2+ on the left side.       Dorsalis pedis pulses are 2+ on the right side and 2+ on the left side.     Heart sounds: Normal heart sounds.  Pulmonary:     Effort:  Pulmonary effort is normal.     Breath sounds: Normal breath sounds.  Abdominal:     General: There is no distension.  Musculoskeletal:     Right shoulder: Normal.     Left shoulder: Normal.     Right elbow: Normal.    Left elbow: Normal.     Right wrist: Normal.     Left wrist: Normal.     Right hip: Normal.     Left hip: Normal.     Right knee: Normal.     Left knee: Normal.     Comments: There is no swelling and tenderness over the lateral malleolus. No overt deformity. Mild tenderness over the medial aspect of the ankle without swelling. The fifth metatarsal is not tender. The ankle joint is intact without excessive opening on stressing. No break in skin. Good pedal pulse and cap refill of all toes. Wiggling toes without difficulty.   Skin:    General: Skin is warm and dry.  Neurological:     Mental Status: She is oriented to person, place, and time.     GCS: GCS eye subscore is 4. GCS verbal subscore is 5. GCS motor subscore is 6.     Comments: Fluent speech, no facial droop.  Psychiatric:        Behavior: Behavior normal.      ED Treatments / Results  Labs (all labs ordered are listed, but only abnormal results are displayed) Labs Reviewed - No data to display  EKG None  Radiology Dg Foot Complete Right  Result Date: 06/28/2019 CLINICAL DATA:  48 year old female status post fall. EXAM: RIGHT FOOT COMPLETE - 3+ VIEW COMPARISON:  None. FINDINGS: Bone mineralization is within normal limits. There is no evidence of fracture or dislocation. There is no evidence of arthropathy or other focal bone abnormality. Soft tissues are unremarkable. IMPRESSION: Negative. Electronically Signed   By: Genevie Ann M.D.   On: 06/28/2019 21:03    Procedures Procedures (including critical care time)  Medications Ordered in ED Medications  acetaminophen (TYLENOL) tablet 650 mg (650 mg Oral Given 06/28/19 2151)     Initial Impression / Assessment and Plan / ED Course  I have reviewed the  triage vital signs and the nursing notes.  Pertinent labs & imaging results that were available during my care of the patient were reviewed by me and considered in my medical decision making (see chart for details).  Patient presents to the ED with complaints of pain to the right ankle pain after falling down 3 steps.  Exam without obvious deformity or open wounds. ROM intact.  Medial malleolus to palpation. NVI distally. Xray viewed by me is without fracture/dislocation.  I assisted the patient in ambulation and she was able to do so with steady gait.  She was able to bear weight on right foot.  Therapeutic splint and crutches provided.Marland Kitchen PRICE and motrin recommended. I discussed results, treatment plan, need for follow-up, and return precautions with the patient. Provided opportunity for questions, patient confirmed understanding and are in agreement with plan.   This note was prepared using Dragon voice recognition software and may include unintentional dictation errors due to the inherent limitations of voice recognition software.    Final Clinical Impressions(s) / ED Diagnoses   Final diagnoses:  Sprain of right ankle, unspecified ligament, initial encounter    ED Discharge Orders    None       Flint Melter 06/28/19 2214    Carmin Muskrat, MD 06/29/19 1345

## 2019-06-28 NOTE — ED Notes (Signed)
Patient verbalizes understanding of discharge instructions. Opportunity for questioning and answers were provided. Armband removed by staff, pt discharged from ED.  

## 2019-06-28 NOTE — ED Triage Notes (Signed)
Pt reports tripping down three stairs, injuring her right foot and landing on her right arm. Redness noted to right foot.

## 2019-06-28 NOTE — Discharge Instructions (Addendum)
Your caregiver has diagnosed you as suffering from an ankle sprain. Ankle sprain/foot sprains occurs when the ligaments that hold the ankle joint together are stretched or torn. It may take 4 to 6 weeks to heal.  -Follow-up: Call orthopedic follow up today today or tomorrow to schedule followup appointment for recheck of ongoing ankle pain that can be canceled with a 24-48 hour notice if complete resolution of pain.  For Activity: Use crutches with non-weight bearing for the first few days. Then, you may walk on your ankle as the pain allows, or as instructed. Start gradually with weight bearing on the affected ankle. Once you can walk pain free, then try jogging. When you can run forwards, then you can try moving side-to-side. If you cannot walk without crutches in one week, you need a re-check.  SEEK IMMEDIATE MEDICAL ATTENTION IF: your toes are numb or tingling, appear gray or blue, or you have severe pain (also elevate leg and loosen splint).

## 2019-09-16 ENCOUNTER — Other Ambulatory Visit: Payer: Self-pay

## 2019-09-16 DIAGNOSIS — Z20822 Contact with and (suspected) exposure to covid-19: Secondary | ICD-10-CM

## 2019-09-17 LAB — NOVEL CORONAVIRUS, NAA: SARS-CoV-2, NAA: NOT DETECTED

## 2020-08-31 ENCOUNTER — Other Ambulatory Visit: Payer: Self-pay | Admitting: Physician Assistant

## 2020-08-31 ENCOUNTER — Ambulatory Visit: Payer: Self-pay | Attending: Critical Care Medicine

## 2020-08-31 ENCOUNTER — Ambulatory Visit: Payer: Self-pay | Admitting: Physician Assistant

## 2020-08-31 ENCOUNTER — Ambulatory Visit: Payer: Self-pay

## 2020-08-31 ENCOUNTER — Other Ambulatory Visit: Payer: Self-pay

## 2020-08-31 VITALS — BP 106/82 | HR 80 | Temp 98.7°F | Resp 18 | Ht 64.0 in | Wt 119.0 lb

## 2020-08-31 DIAGNOSIS — F5104 Psychophysiologic insomnia: Secondary | ICD-10-CM

## 2020-08-31 DIAGNOSIS — H65192 Other acute nonsuppurative otitis media, left ear: Secondary | ICD-10-CM

## 2020-08-31 DIAGNOSIS — Z23 Encounter for immunization: Secondary | ICD-10-CM

## 2020-08-31 DIAGNOSIS — F411 Generalized anxiety disorder: Secondary | ICD-10-CM

## 2020-08-31 DIAGNOSIS — F310 Bipolar disorder, current episode hypomanic: Secondary | ICD-10-CM

## 2020-08-31 MED ORDER — QUETIAPINE FUMARATE 25 MG PO TABS: ORAL_TABLET | ORAL | 1 refills | Status: DC

## 2020-08-31 MED ORDER — HYDROXYZINE PAMOATE 25 MG PO CAPS: 25.0000 mg | ORAL_CAPSULE | Freq: Three times a day (TID) | ORAL | 1 refills | Status: DC | PRN

## 2020-08-31 MED ORDER — AMOXICILLIN-POT CLAVULANATE 875-125 MG PO TABS: 1.0000 | ORAL_TABLET | Freq: Two times a day (BID) | ORAL | 0 refills | Status: DC

## 2020-08-31 MED FILL — AMOX-CLAV 875-125 MG TABLET: 875-125 | 7 days supply | Qty: 14 | Fill #0

## 2020-08-31 MED FILL — QUETIAPINE FUMARATE 25 MG T: 25 | 30 days supply | Qty: 30 | Fill #0

## 2020-08-31 MED FILL — HYDROXYZINE PAM 25 MG CAP: 25 | 10 days supply | Qty: 30 | Fill #0

## 2020-08-31 NOTE — Patient Instructions (Signed)
You will take Augmentin twice a day for 10 days for your ear infection.  Please return to the mobile unit in one week for follow up and fasting labs  I hope that you feel better soon  Kennieth Rad, PA-C Physician Assistant [facility] [email or web address]    Otitis media en el adulto Otitis Media, Adult  Se llama otitis media a la inflamacin y la acumulacin de lquido en el odo medio. El odo medio es la parte del odo que contiene los huesos de la audicin, as Contractor aire que ayuda a Conservator, museum/gallery los sonidos al cerebro. Cules son las causas? Esta afeccin es consecuencia de una obstruccin de la trompa de [place]. Este conducto drena lquido del odo a la parte posterior de la nariz (nasofaringe). Un objeto o la hinchazn (edema) del conducto podran provocar la obstruccin de [place]. Algunos de los problemas que pueden causar una obstruccin:  Resfriados u otra infeccin de las vas respiratorias superiores.  Alergias.  Un irritante, como el humo del tabaco.  Hipertrofia de [place]. Las adenoides son zonas de tejido blando ubicadas en la parte posterior de la garganta, detrs de la nariz y Company secretary.  Un bulto en la nasofaringe.  Dao en el odo a causa de cambios de presin (barotraumatismo). Cules son los signos o los sntomas? Los sntomas de esta afeccin incluyen lo siguiente:  Dolor de odo.  Cristy Hilts.  Disminucin de la audicin.  Dolor de [place].  Cansancio (letargo).  Supuracin de lquido por el odo.  Zumbidos en el odo. Cmo se diagnostica? Esta afeccin se diagnostica mediante un examen fsico. Durante el examen, con un instrumento llamado otoscopio, el mdico mirar dentro del odo para Geographical information systems officer, hinchazn y presencia de lquido. Tambin le preguntar acerca de sus sntomas. El mdico tambin puede indicarle [place], como:  Estudio para Freight forwarder movimiento del  tmpano (otoscopia neumtica). Se realiza introduciendo una pequea cantidad de aire en el odo.  Estudio que cambia la presin del aire del odo medio para Freight forwarder modo en que el tmpano se mueve y si la trompa de [place] funciona (timpanograma). Cmo se trata? Por lo general, esta afeccin desaparece sin tratamiento en el transcurso de 3a5das. Sin embargo, si la causa de la afeccin es una infeccin bacteriana y no desaparece sin tratamiento, o si vuelve a aparecer ms de una vez, el mdico podra realizar lo siguiente:  Recetarle antibiticos para tratar la infeccin.  Recetarle o recomendarle medicamentos para Financial controller. Siga estas instrucciones en su casa:  Tome los medicamentos de venta libre y los recetados solamente como se lo haya indicado el mdico.  Si le recetaron un antibitico, tmelo como se lo haya indicado el mdico. No deje de tomar los antibiticos aunque comience a sentirse mejor.  Concurra a todas las visitas de control como se lo haya indicado el mdico. Esto es importante. Comunquese con un mdico si:  Le sangra la nariz.  Tiene un bulto en el cuello.  No mejora luego de 5das.  Empeora en lugar de mejorar. Solicite ayuda de inmediato si:  Tiene dolor intenso que no puede controlar con medicamentos.  Tiene hinchazn, enrojecimiento o dolor en el odo.  Siente rigidez en el cuello.  Una parte de su rostro se paraliza.  Le duele el hueso que se encuentra detrs del odo (mastoides) al tocarlo.  Dolor de cabeza intenso. Resumen  Se llama otitis media al enrojecimiento, el dolor y la hinchazn del odo medio.  Por lo general, esta afeccin desaparece sin tratamiento en el transcurso de 3a5das.  Si el problema no desaparece en el trmino de 3 a 5 das, el mdico podra prescribirle o recomendarle medicamentos para tratar los sntomas.  Si le recetaron un antibitico, tmelo como se lo haya indicado el mdico. Esta informacin  no tiene Marine scientist el consejo del mdico. Asegrese de hacerle al mdico cualquier pregunta que tenga. Document Revised: 04/16/2017 Document Reviewed: 06/30/2013 Elsevier Patient Education  [place].
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
## 2020-08-31 NOTE — Progress Notes (Signed)
   Covid-19 Vaccination Clinic  Name:  Gina Burton    MRN: [id number] DOB: [birth year]  08/31/2020  Ms. Gina Burton was observed post Covid-19 immunization for 15 minutes without incident. She was provided with Vaccine Information Sheet and instruction to access the V-Safe system.   Ms. Gina Burton was instructed to call 911 with any severe reactions post vaccine: Marland Kitchen Difficulty breathing  . Swelling of face and throat  . A fast heartbeat  . A bad rash all over body  . Dizziness and weakness   Immunizations Administered    Name Date Dose VIS Date Route   Moderna COVID-19 Vaccine 08/31/2020 11:30 AM 0.5 mL 11/2019 Intramuscular   Manufacturer: Moderna   Lot: [id number]   [place]: [id number]

## 2020-09-07 ENCOUNTER — Other Ambulatory Visit: Payer: Self-pay

## 2020-09-08 ENCOUNTER — Telehealth: Payer: Self-pay | Admitting: *Deleted

## 2020-09-08 ENCOUNTER — Other Ambulatory Visit: Payer: Self-pay

## 2020-09-08 ENCOUNTER — Ambulatory Visit: Payer: Self-pay | Attending: Critical Care Medicine | Admitting: Licensed Clinical Social Worker

## 2020-09-08 ENCOUNTER — Ambulatory Visit: Payer: Self-pay | Attending: Critical Care Medicine

## 2020-09-08 DIAGNOSIS — F5104 Psychophysiologic insomnia: Secondary | ICD-10-CM

## 2020-09-08 DIAGNOSIS — F310 Bipolar disorder, current episode hypomanic: Secondary | ICD-10-CM

## 2020-09-08 DIAGNOSIS — F411 Generalized anxiety disorder: Secondary | ICD-10-CM

## 2020-09-08 NOTE — Telephone Encounter (Signed)
Orders placed for lab visit  

## 2020-09-10 LAB — CBC WITH DIFFERENTIAL/PLATELET
Basophils Absolute: 0.1 10*3/uL (ref 0.0–0.2)
Basos: 1 %
EOS (ABSOLUTE): 0.2 10*3/uL (ref 0.0–0.4)
Eos: 3 %
Hematocrit: 28 % — ABNORMAL LOW (ref 34.0–46.6)
Hemoglobin: 8.2 g/dL — ABNORMAL LOW (ref 11.1–15.9)
Immature Grans (Abs): 0 10*3/uL (ref 0.0–0.1)
Immature Granulocytes: 0 %
Lymphocytes Absolute: 2 10*3/uL (ref 0.7–3.1)
Lymphs: 27 %
MCH: 20.3 pg — ABNORMAL LOW (ref 26.6–33.0)
MCHC: 29.3 g/dL — ABNORMAL LOW (ref 31.5–35.7)
MCV: 69 fL — ABNORMAL LOW (ref 79–97)
Monocytes Absolute: 0.5 10*3/uL (ref 0.1–0.9)
Monocytes: 7 %
Neutrophils Absolute: 4.8 10*3/uL (ref 1.4–7.0)
Neutrophils: 62 %
Platelets: 381 10*3/uL (ref 150–450)
RBC: 4.04 x10E6/uL (ref 3.77–5.28)
RDW: 16.2 % — ABNORMAL HIGH (ref 11.7–15.4)
WBC: 7.7 10*3/uL (ref 3.4–10.8)

## 2020-09-10 LAB — COMPREHENSIVE METABOLIC PANEL
ALT: 13 IU/L (ref 0–32)
AST: 10 IU/L (ref 0–40)
Albumin/Globulin Ratio: 1.3 (ref 1.2–2.2)
Albumin: 4 g/dL (ref 3.8–4.8)
Alkaline Phosphatase: 86 IU/L (ref 44–121)
BUN/Creatinine Ratio: 29 — ABNORMAL HIGH (ref 9–23)
BUN: 17 mg/dL (ref 6–24)
Bilirubin Total: <0.2 mg/dL (ref 0.0–1.2)
CO2: 19 mmol/L — ABNORMAL LOW (ref 20–29)
Calcium: 8.7 mg/dL (ref 8.7–10.2)
Chloride: 103 mmol/L (ref 96–106)
Creatinine, Ser: 0.59 mg/dL (ref 0.57–1.00)
GFR calc Af Amer: 125 mL/min/{1.73_m2} (ref >59)
GFR calc non Af Amer: 109 mL/min/{1.73_m2} (ref >59)
Globulin, Total: 3.1 g/dL (ref 1.5–4.5)
Glucose: 93 mg/dL (ref 65–99)
Potassium: 4.4 mmol/L (ref 3.5–5.2)
Sodium: 137 mmol/L (ref 134–144)
Total Protein: 7.1 g/dL (ref 6.0–8.5)

## 2020-09-10 LAB — LIPID PANEL
Chol/HDL Ratio: 2.4 ratio (ref 0.0–4.4)
Cholesterol, Total: 158 mg/dL (ref 100–199)
HDL: 66 mg/dL (ref >39)
LDL Chol Calc (NIH): 81 mg/dL (ref 0–99)
Triglycerides: 52 mg/dL (ref 0–149)
VLDL Cholesterol Cal: 11 mg/dL (ref 5–40)

## 2020-09-10 LAB — HCV RNA QUANT: Hepatitis C Quantitation: NOT DETECTED IU/mL

## 2020-09-10 LAB — TSH: TSH: 2.06 u[IU]/mL (ref 0.450–4.500)

## 2020-09-10 LAB — VITAMIN D 25 HYDROXY (VIT D DEFICIENCY, FRACTURES): Vit D, 25-Hydroxy: 24 ng/mL — ABNORMAL LOW (ref 30.0–100.0)

## 2020-09-12 ENCOUNTER — Other Ambulatory Visit: Payer: Self-pay | Admitting: Physician Assistant

## 2020-09-12 DIAGNOSIS — D649 Anemia, unspecified: Secondary | ICD-10-CM

## 2020-09-13 ENCOUNTER — Encounter: Payer: Self-pay | Admitting: *Deleted

## 2020-09-13 ENCOUNTER — Other Ambulatory Visit: Payer: Self-pay

## 2020-09-13 ENCOUNTER — Ambulatory Visit: Payer: Self-pay | Attending: Critical Care Medicine

## 2020-09-13 DIAGNOSIS — D649 Anemia, unspecified: Secondary | ICD-10-CM

## 2020-09-13 NOTE — Progress Notes (Unsigned)
Medical Assistant used [employer] to contact patient.  Interpreter Name: Loreta Ave #: [id number] Patient verified DOB Patient

## 2020-09-14 ENCOUNTER — Telehealth: Payer: Self-pay | Admitting: *Deleted

## 2020-09-14 LAB — IRON,TIBC AND FERRITIN PANEL
Ferritin: 4 ng/mL — ABNORMAL LOW (ref 15–150)
Iron Saturation: 3 % — CL (ref 15–55)
Iron: 13 ug/dL — ABNORMAL LOW (ref 27–159)
Total Iron Binding Capacity: 463 ug/dL — ABNORMAL HIGH (ref 250–450)
UIBC: 450 ug/dL — ABNORMAL HIGH (ref 131–425)

## 2020-09-14 NOTE — Telephone Encounter (Signed)
-----   Message from Kennieth Rad, [place] sent at 09/14/2020 12:20 PM EDT ----- Please call patient and let her know that her iron levels are very low, she does need to start iron ferrous sulfate oral supplementation over-the-counter 325 mgm once daily.  If tolerated, she should increase to 2-3 tabs daily over the next couple of weeks. Please let her know the potential side effects  of iron are primarily GI in type, such as cramping, constipation, black stools.   She needs to have her CBC and iron levels rechecked in 6 weeks.  Thank you

## 2020-09-14 NOTE — Telephone Encounter (Signed)
Medical Assistant used [employer] to contact patient.  Interpreter Name: Marcene Brawn Interpreter #: [phone] Patient is aware of low iron levels being noted and reiterated to take 1 tablet for 1 week and increase to 2-3 tablets daily as long as she tolerates the 1 tablet. Patient aware of the side effects and a recheck being completed in 6 weeks.

## 2020-09-15 ENCOUNTER — Other Ambulatory Visit: Payer: Self-pay

## 2020-09-15 ENCOUNTER — Ambulatory Visit: Payer: Self-pay | Attending: Family Medicine | Admitting: Licensed Clinical Social Worker

## 2020-09-15 DIAGNOSIS — F329 Major depressive disorder, single episode, unspecified: Secondary | ICD-10-CM
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
## 2020-09-26 ENCOUNTER — Ambulatory Visit (HOSPITAL_BASED_OUTPATIENT_CLINIC_OR_DEPARTMENT_OTHER): Payer: Self-pay | Admitting: Internal Medicine

## 2020-09-26 ENCOUNTER — Ambulatory Visit: Payer: Self-pay | Attending: Family Medicine | Admitting: Licensed Clinical Social Worker

## 2020-09-26 ENCOUNTER — Ambulatory Visit: Payer: Self-pay | Admitting: Family Medicine

## 2020-09-26 ENCOUNTER — Other Ambulatory Visit: Payer: Self-pay

## 2020-09-26 DIAGNOSIS — Z538 Procedure and treatment not carried out for other reasons: Secondary | ICD-10-CM

## 2020-09-26 DIAGNOSIS — F411 Generalized anxiety disorder: Secondary | ICD-10-CM

## 2020-09-26 DIAGNOSIS — F332 Major depressive disorder, recurrent severe without psychotic features: Secondary | ICD-10-CM

## 2020-09-26 MED ORDER — HYDROXYZINE PAMOATE 25 MG PO CAPS: 50.0000 mg | ORAL_CAPSULE | Freq: Two times a day (BID) | ORAL | 1 refills | Status: DC

## 2020-09-26 MED FILL — HYDROXYZINE PAM 25 MG CAP: 25 | 10 days supply | Qty: 30 | Fill #1

## 2020-09-26 MED FILL — QUETIAPINE FUMARATE 25 MG T: 25 | 30 days supply | Qty: 30 | Fill #1

## 2020-09-26 NOTE — Progress Notes (Signed)
Virtual Visit via Telephone Note Due to current restrictions/limitations of in-office visits due to the COVID-19 pandemic, this scheduled clinical appointment was converted to a telehealth visit I connected with Gina Burton on 09/26/20 at  3:10 PM EDT by telephone and verified that I am speaking with the correct person using two identifiers.   I am in my office.  The patient is at home.  Only the patient, myself and interpreter from [employer] participated in this encounter.  I discussed the limitations, risks, security and privacy concerns of performing an evaluation and management service by telephone and the availability of in person appointments. I also discussed with the patient that there may be a patient responsible charge related to this service. The patient expressed understanding and agreed to proceed.   History of Present Illness: Patient with history of anxiety, bipolar disorder.  Patient was scheduled to see Dr. Chapman Fitch today but she did not come in today so I was asked to see this patient.  She is basically needing refill on hydroxyzine which was prescribed to her by PA Dedra Skeens last month for anxiety.  Patient reports that she is doing well on the medication.  She is taking 25 mg 2 tablets twice a day.  She also met with Jasmine today.  She offers no other concern.   Observations/Objective: No direct observation done as this was a telephone encounter.  Assessment and Plan: 1. GAD (generalized anxiety disorder) Refill sent on hydroxyzine to our pharmacy.  Follow Up Instructions: As previously scheduled   I discussed the assessment and treatment plan with the patient. The patient was provided an opportunity to ask questions and all were answered. The patient agreed with the plan and demonstrated an understanding of the instructions.   The patient was advised to call back or seek an in-person evaluation if the symptoms worsen or if the condition fails  to improve as anticipated.  I provided 8 minutes of non-face-to-face time during this encounter.   Karle Plumber, MD

## 2020-09-26 NOTE — Progress Notes (Signed)
Open in error

## 2020-09-28 ENCOUNTER — Telehealth: Payer: Self-pay

## 2020-09-28 ENCOUNTER — Ambulatory Visit: Payer: Self-pay | Admitting: *Deleted

## 2020-09-28 ENCOUNTER — Ambulatory Visit: Payer: Self-pay | Attending: Critical Care Medicine

## 2020-09-28 DIAGNOSIS — Z23 Encounter for immunization: Secondary | ICD-10-CM

## 2020-09-28 NOTE — Progress Notes (Signed)
° °  Covid-19 Vaccination Clinic  Name:  Gina Burton    MRN: [id number] DOB: 1971/01/26  09/28/2020  Ms. Gina Burton was observed post Covid-19 immunization for 15 minutes without incident. She was provided with Vaccine Information Sheet and instruction to access the V-Safe system.   Ms. Gina Burton was instructed to call 911 with any severe reactions post vaccine:  Difficulty breathing   Swelling of face and throat   A fast heartbeat   A bad rash all over body   Dizziness and weakness   Immunizations Administered    Name Date Dose VIS Date Route   Moderna COVID-19 Vaccine 09/28/2020  3:40 AM 0.5 mL 11/2019 Intramuscular   Manufacturer: Moderna   Lot: [id number]   [place]itos: [id number]

## 2020-09-28 NOTE — Progress Notes (Signed)
Patient tolerated injection well today. Patient observed with no concerns.

## 2020-09-28 NOTE — Telephone Encounter (Signed)
Contacted patient on the visiting [facility] for 2nd vaccination.

## 2020-09-30 ENCOUNTER — Other Ambulatory Visit: Payer: Self-pay
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
## 2020-10-03 ENCOUNTER — Telehealth: Payer: Self-pay | Admitting: Internal Medicine

## 2020-10-03 NOTE — Telephone Encounter (Signed)
Copied from [place] [id number]. Topic: General - Other >> Sep 30, 2020 10:56 AM Keene Breath wrote: Reason for CRM: Patient called to speak with the social worker regarding her previous appt.  CB# [phone]

## 2020-10-03 NOTE — Telephone Encounter (Signed)
Needs assistance asap, today if at all possible with [place]  PD and housing legal situation.

## 2020-10-06 ENCOUNTER — Ambulatory Visit: Payer: Self-pay | Attending: Internal Medicine | Admitting: Licensed Clinical Social Worker

## 2020-10-06 ENCOUNTER — Other Ambulatory Visit: Payer: Self-pay

## 2020-10-06 ENCOUNTER — Ambulatory Visit: Payer: Self-pay | Admitting: Family Medicine

## 2020-10-06 DIAGNOSIS — F439 Reaction to severe stress, unspecified: Secondary | ICD-10-CM
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
## 2020-11-04 ENCOUNTER — Ambulatory Visit (INDEPENDENT_AMBULATORY_CARE_PROVIDER_SITE_OTHER): Payer: Self-pay | Admitting: Primary Care

## 2020-11-11 ENCOUNTER — Ambulatory Visit: Payer: Self-pay | Admitting: *Deleted

## 2020-11-11 NOTE — Telephone Encounter (Signed)
Pt called in c/o upper respiratory symptoms a sore throat, runny nose and headache and just not feeling well.   Denies exposures to COVID-19.  No fever.  She called in using Spanish interpreter Elisa [phone].  The office is closed today for the Thanksgiving holiday and will reopen on Monday. After informing her of this she has decided to go on to the urgent care center since she feels so bad.  She asked about her medication refills.   I let her know to call her pharmacy and they would submit a request for the refills.   She was agreeable to this.    Reason for Disposition . Common cold with no complications  Answer Assessment - Initial Assessment Questions 1. ONSET: "When did the nasal discharge start?"      Sore throat, runny nose, and headache.  No COVID exposure.  No fever. 2. AMOUNT: "How much discharge is there?"      Runny nose 3. COUGH: "Do you have a cough?" If yes, ask: "Describe the color of your sputum" (clear, white, yellow, green)     Yes  But not coughing up anything. 4. RESPIRATORY DISTRESS: "Describe your breathing."      I have asthma. 5. FEVER: "Do you have a fever?" If Yes, ask: "What is your temperature, how was it measured, and when did it start?"     No 6. SEVERITY: "Overall, how bad are you feeling right now?" (e.g., doesn't interfere with normal activities, staying home from school/work, staying in bed)      Not feel well 7. OTHER SYMPTOMS: "Do you have any other symptoms?" (e.g., sore throat, earache, wheezing, vomiting)     Sore throat and symptoms listed above. 8. PREGNANCY: "Is there any chance you are pregnant?" "When was your last menstrual period?"     Not asked  Protocols used: COMMON COLD-A-AH

## 2020-12-06 ENCOUNTER — Other Ambulatory Visit: Payer: Self-pay

## 2020-12-06 ENCOUNTER — Emergency Department (HOSPITAL_COMMUNITY)
Admission: EM | Admit: 2020-12-06 | Discharge: 2020-12-06 | Disposition: A | Discharge status: Home / Self Care | Payer: Self-pay | Attending: Emergency Medicine | Admitting: Emergency Medicine

## 2020-12-06 ENCOUNTER — Emergency Department (HOSPITAL_COMMUNITY): Payer: Self-pay

## 2020-12-06 ENCOUNTER — Encounter (HOSPITAL_COMMUNITY): Payer: Self-pay | Admitting: Pharmacy Technician

## 2020-12-06 DIAGNOSIS — S2232XA Fracture of one rib, left side, initial encounter for closed fracture: Secondary | ICD-10-CM | POA: Insufficient documentation

## 2020-12-06 DIAGNOSIS — J45909 Unspecified asthma, uncomplicated: Secondary | ICD-10-CM | POA: Insufficient documentation

## 2020-12-06 DIAGNOSIS — T1490XA Injury, unspecified, initial encounter: Secondary | ICD-10-CM

## 2020-12-06 DIAGNOSIS — Y92410 Unspecified street and highway as the place of occurrence of the external cause: Secondary | ICD-10-CM | POA: Insufficient documentation

## 2020-12-06 DIAGNOSIS — F17210 Nicotine dependence, cigarettes, uncomplicated: Secondary | ICD-10-CM | POA: Insufficient documentation

## 2020-12-06 LAB — CBC
HCT: 29.3 % — ABNORMAL LOW (ref 36.0–46.0)
Hemoglobin: 8.4 g/dL — ABNORMAL LOW (ref 12.0–15.0)
MCH: 19.7 pg — ABNORMAL LOW (ref 26.0–34.0)
MCHC: 28.7 g/dL — ABNORMAL LOW (ref 30.0–36.0)
MCV: 68.6 fL — ABNORMAL LOW (ref 80.0–100.0)
Platelets: 352 10*3/uL (ref 150–400)
RBC: 4.27 MIL/uL (ref 3.87–5.11)
RDW: 19.2 % — ABNORMAL HIGH (ref 11.5–15.5)
WBC: 7.4 10*3/uL (ref 4.0–10.5)
nRBC: 0 % (ref 0.0–0.2)

## 2020-12-06 LAB — I-STAT CHEM 8, ED
BUN: 19 mg/dL (ref 6–20)
Calcium, Ion: 1.07 mmol/L — ABNORMAL LOW (ref 1.15–1.40)
Chloride: 104 mmol/L (ref 98–111)
Creatinine, Ser: 0.5 mg/dL (ref 0.44–1.00)
Glucose, Bld: 98 mg/dL (ref 70–99)
HCT: 29 % — ABNORMAL LOW (ref 36.0–46.0)
Hemoglobin: 9.9 g/dL — ABNORMAL LOW (ref 12.0–15.0)
Potassium: 2.9 mmol/L — ABNORMAL LOW (ref 3.5–5.1)
Sodium: 139 mmol/L (ref 135–145)
TCO2: 23 mmol/L (ref 22–32)

## 2020-12-06 LAB — COMPREHENSIVE METABOLIC PANEL
ALT: 15 U/L (ref 0–44)
AST: 21 U/L (ref 15–41)
Albumin: 3.6 g/dL (ref 3.5–5.0)
Alkaline Phosphatase: 75 U/L (ref 38–126)
Anion gap: 13 (ref 5–15)
BUN: 14 mg/dL (ref 6–20)
CO2: 21 mmol/L — ABNORMAL LOW (ref 22–32)
Calcium: 8.5 mg/dL — ABNORMAL LOW (ref 8.9–10.3)
Chloride: 104 mmol/L (ref 98–111)
Creatinine, Ser: 0.68 mg/dL (ref 0.44–1.00)
GFR, Estimated: >60 mL/min (ref >60)
Glucose, Bld: 104 mg/dL — ABNORMAL HIGH (ref 70–99)
Potassium: 2.9 mmol/L — ABNORMAL LOW (ref 3.5–5.1)
Sodium: 138 mmol/L (ref 135–145)
Total Bilirubin: 0.5 mg/dL (ref 0.3–1.2)
Total Protein: 7 g/dL (ref 6.5–8.1)

## 2020-12-06 LAB — SAMPLE TO BLOOD BANK

## 2020-12-06 LAB — I-STAT BETA HCG BLOOD, ED (MC, WL, AP ONLY): I-stat hCG, quantitative: <5 m[IU]/mL (ref <5)

## 2020-12-06 LAB — PROTIME-INR
INR: 1.1 (ref 0.8–1.2)
Prothrombin Time: 14 seconds (ref 11.4–15.2)

## 2020-12-06 LAB — LACTIC ACID, PLASMA: Lactic Acid, Venous: 1.2 mmol/L (ref 0.5–1.9)

## 2020-12-06 LAB — ETHANOL: Alcohol, Ethyl (B): <10 mg/dL (ref <10)

## 2020-12-06 MED ORDER — POTASSIUM CHLORIDE CRYS ER 20 MEQ PO TBCR
40.0000 meq | EXTENDED_RELEASE_TABLET | Freq: Once | ORAL | Status: DC
  Filled 2020-12-06: qty 2

## 2020-12-06 MED ORDER — HYDROMORPHONE HCL 1 MG/ML IJ SOLN
0.5000 mg | Freq: Once | INTRAMUSCULAR | Status: AC
Start: 2020-12-06 — End: 2020-12-06
  Administered 2020-12-06: 18:00:00 0.5 mg via INTRAVENOUS
  Filled 2020-12-06: qty 1

## 2020-12-06 MED ORDER — HYDROMORPHONE HCL 1 MG/ML IJ SOLN
0.5000 mg | Freq: Once | INTRAMUSCULAR | Status: AC
Start: 2020-12-06 — End: 2020-12-06
  Administered 2020-12-06: 16:00:00 0.5 mg via INTRAVENOUS
  Filled 2020-12-06: qty 1

## 2020-12-06 MED ORDER — POTASSIUM CHLORIDE CRYS ER 20 MEQ PO TBCR
60.0000 meq | EXTENDED_RELEASE_TABLET | Freq: Once | ORAL | Status: AC
  Administered 2020-12-06: 19:00:00 60 meq via ORAL

## 2020-12-06 MED ORDER — IOHEXOL 300 MG/ML  SOLN
100.0000 mL | Freq: Once | INTRAMUSCULAR | Status: AC | PRN
  Administered 2020-12-06: 100 mL via INTRAVENOUS

## 2020-12-06 MED ORDER — HYDROCODONE-ACETAMINOPHEN 5-325 MG PO TABS: 1.0000 | ORAL_TABLET | Freq: Four times a day (QID) | ORAL | 0 refills | Status: DC | PRN

## 2020-12-06 MED ORDER — ONDANSETRON HCL 4 MG/2ML IJ SOLN
4.0000 mg | Freq: Once | INTRAMUSCULAR | Status: AC
  Administered 2020-12-06: 16:00:00 4 mg via INTRAVENOUS
  Filled 2020-12-06: qty 2

## 2020-12-06 NOTE — Discharge Instructions (Addendum)
Como comentamos, le estoy recetando un narctico potente llamado Vicodin. Tmelo solo segn lo prescrito. No mezclar con alcohol. No opere un vehculo de motor despus de tomarlo.  Tambin les he dado [employer]. selo a lo largo del da para ayudar a su cuerpo a respirar profundamente y para continuar trabajando en el fortalecimiento de los msculos que ayudan a Ambulance person. Esto ayudar con el proceso de curacin y con la prevencin de neumonas futuras.  Haga un seguimiento con su mdico habitual si el dolor persiste. Siempre puede regresar al departamento de emergencias para una reevaluacin si su dolor empeora una vez ms. Fue Engineer, technical sales.
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
## 2020-12-06 NOTE — ED Notes (Signed)
Patient Alert and oriented to baseline. Stable and ambulatory to baseline. Patient verbalized understanding of the discharge instructions.  Patient belongings were taken by the patient.   
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
## 2020-12-22 ENCOUNTER — Ambulatory Visit: Payer: Self-pay | Admitting: Family Medicine

## 2020-12-27 ENCOUNTER — Telehealth: Payer: Self-pay | Admitting: Internal Medicine

## 2020-12-27 NOTE — Telephone Encounter (Signed)
Returned pt call and [place] interpreter for me. Made pt aware that we do not prescribe narcotics also made pt aware that she will need to wait till her appt on 1/13 with Dr. Leanne Chang to get medications refilled.
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
## 2020-12-29 ENCOUNTER — Ambulatory Visit: Payer: No Typology Code available for payment source | Attending: Internal Medicine | Admitting: Internal Medicine

## 2020-12-29 ENCOUNTER — Encounter: Payer: Self-pay | Admitting: Internal Medicine

## 2020-12-29 ENCOUNTER — Other Ambulatory Visit: Payer: Self-pay

## 2020-12-29 VITALS — BP 111/71 | HR 76 | Temp 97.8°F | Resp 16 | Ht 64.0 in | Wt 128.0 lb

## 2020-12-29 DIAGNOSIS — S2232XD Fracture of one rib, left side, subsequent encounter for fracture with routine healing: Secondary | ICD-10-CM

## 2020-12-29 MED ORDER — CELECOXIB 100 MG PO CAPS: 100.0000 mg | ORAL_CAPSULE | Freq: Every day | ORAL | 0 refills | Status: DC

## 2020-12-29 MED ORDER — LIDOCAINE 4 % EX PTCH: MEDICATED_PATCH | CUTANEOUS | 0 refills | Status: DC
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
## 2021-01-03 ENCOUNTER — Other Ambulatory Visit: Payer: Self-pay

## 2021-01-03 ENCOUNTER — Ambulatory Visit: Payer: Self-pay | Attending: Family Medicine | Admitting: Family Medicine

## 2021-03-01 ENCOUNTER — Other Ambulatory Visit: Payer: Self-pay

## 2021-03-01 ENCOUNTER — Ambulatory Visit: Payer: Self-pay | Attending: Family Medicine | Admitting: Family Medicine

## 2021-03-06 ENCOUNTER — Other Ambulatory Visit: Payer: Self-pay | Admitting: Physician Assistant

## 2021-03-06 ENCOUNTER — Ambulatory Visit: Payer: Self-pay | Admitting: Physician Assistant

## 2021-03-06 ENCOUNTER — Other Ambulatory Visit: Payer: Self-pay

## 2021-03-06 VITALS — BP 117/69 | HR 63 | Temp 98.2°F | Resp 18 | Ht 64.0 in | Wt 120.0 lb

## 2021-03-06 DIAGNOSIS — F310 Bipolar disorder, current episode hypomanic: Secondary | ICD-10-CM

## 2021-03-06 DIAGNOSIS — F5104 Psychophysiologic insomnia: Secondary | ICD-10-CM

## 2021-03-06 DIAGNOSIS — S2232XS Fracture of one rib, left side, sequela: Secondary | ICD-10-CM

## 2021-03-06 DIAGNOSIS — D649 Anemia, unspecified: Secondary | ICD-10-CM | POA: Insufficient documentation

## 2021-03-06 DIAGNOSIS — E876 Hypokalemia: Secondary | ICD-10-CM

## 2021-03-06 DIAGNOSIS — D508 Other iron deficiency anemias: Secondary | ICD-10-CM

## 2021-03-06 DIAGNOSIS — Z560 Unemployment, unspecified: Secondary | ICD-10-CM

## 2021-03-06 DIAGNOSIS — E559 Vitamin D deficiency, unspecified: Secondary | ICD-10-CM

## 2021-03-06 DIAGNOSIS — R0781 Pleurodynia: Secondary | ICD-10-CM

## 2021-03-06 DIAGNOSIS — S2232XA Fracture of one rib, left side, initial encounter for closed fracture: Secondary | ICD-10-CM | POA: Insufficient documentation

## 2021-03-06 DIAGNOSIS — F411 Generalized anxiety disorder: Secondary | ICD-10-CM | POA: Insufficient documentation

## 2021-03-06 MED ORDER — LIDOCAINE 4 % EX PTCH: MEDICATED_PATCH | CUTANEOUS | 0 refills | Status: DC

## 2021-03-06 MED ORDER — QUETIAPINE FUMARATE 25 MG PO TABS: ORAL_TABLET | ORAL | 1 refills | Status: DC

## 2021-03-06 MED ORDER — IRON (FERROUS SULFATE) 325 (65 FE) MG PO TABS: 1.0000 | ORAL_TABLET | Freq: Two times a day (BID) | ORAL | 2 refills | Status: DC

## 2021-03-06 MED ORDER — HYDROXYZINE PAMOATE 25 MG PO CAPS
50.0000 mg | ORAL_CAPSULE | Freq: Two times a day (BID) | ORAL | 1 refills | Status: DC
Start: 2021-03-06 — End: 2021-03-06

## 2021-03-06 MED FILL — FERROUS SULFATE 325 MG TAB: 325 (65 FE) | 30 days supply | Qty: 60 | Fill #0

## 2021-03-06 MED FILL — QUETIAPINE FUMARATE 25 MG T: 25 | 30 days supply | Qty: 30 | Fill #0

## 2021-03-06 MED FILL — HYDROXYZINE PAM 25 MG CAP: 25 | 30 days supply | Qty: 120 | Fill #0
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
## 2021-03-06 NOTE — Patient Instructions (Addendum)
You are going to take iron once a day for 1 week then start taking it twice a day.  Make sure to take it with meals to avoid upset stomach.  You need to have your iron levels rechecked in 6 weeks.  We have scheduled you an appointment to be seen by your primary care provider, this appointment is not for 3 months, so it is important for you to return to the mobile unit in 6 weeks to have these levels rechecked and for follow-up.  [facility]  We will call you with your lab and xray results.  Please let us know if there is anything else we can do for you  Gina Rad, PA-C Physician Assistant [facility] [email or web address]    Anemia por deficiencia de hierro en los adultos Iron Deficiency Anemia, Adult La anemia por deficiencia de hierro es una afeccin en la que la concentracin de glbulos rojos o hemoglobina en la sangre est por debajo de lo normal debido a la falta de hierro. La hemoglobina es la sustancia de los glbulos rojos que lleva el oxgeno a todos los tejidos del cuerpo. Cuando la concentracin de los glbulos rojos o de la hemoglobina es demasiado baja, no llega suficiente oxgeno a estos tejidos. La anemia por deficiencia de hierro, generalmente, es de larga duracin y se desarrolla con Physiological scientist. Puede o no causar sntomas. Este es un tipo comn de anemia. Cules son las causas? Esta afeccin puede ser causada por lo siguiente:  Insuficiencia de Chartered certified accountant.  Absorcin intestinal anormal.  Aumento de la necesidad de hierro debido al [employer] o a perodos menstruales abundantes en las mujeres.  Cncer en el aparato digestivo, como cncer de colon.  Prdida de sangre a causa de una hemorragia en el intestino. Esto puede deberse a una afeccin gastrointestinal, como la enfermedad de Crohn.  Extraccin de Biochemist, clinical con frecuencia, como en donaciones  de [place]. Qu incrementa el riesgo? Los siguientes factores pueden hacer que sea ms propenso a Emergency planning/management officer afeccin:  Public relations account executive.  Ser una adolescente que atraviesa un estirn puberal. [place] son los signos o sntomas? Los sntomas de esta afeccin pueden incluir los siguientes:  Piel, labios y uas plidos.  Debilidad, mareos y cansarse con facilidad.  Dolor de [place].  Falta de aire al [employer] o al hacer ejercicio.  Manos y pies fros.  Latidos cardacos rpidos o irregulares.  Irritabilidad o respiracin rpida. Estos sntomas son ms frecuentes en los casos de anemia grave. Los casos leves de anemia podran no causar sntomas. Cmo se diagnostica? Esta afeccin se diagnostica en funcin de lo siguiente:  Sus antecedentes mdicos.  Un examen fsico.  Anlisis de sangre. Es posible que le realicen otros estudios para Pension scheme manager la causa subyacente de la anemia; por ejemplo:  Anlisis para Hydrographic surveyor la presencia de [employer] heces (anlisis de sangre oculta en heces).  Un procedimiento para examinar el interior del colon y el recto (colonoscopia).  Un procedimiento para examinar el interior del esfago y Product manager (endoscopa).  Un estudio en el que se toman clulas de la mdula sea (aspiracin de mdula sea) o se extrae lquido de la mdula sea para examinarlo. Esto debe realizarse en contadas ocasiones. Cmo se trata? Esta afeccin se trata remediando la causa de la deficiencia de hierro. El tratamiento puede implicar lo siguiente:  Agregarle a la dieta alimentos ricos en hierro.  Tomar suplementos de  hierro. Si est embarazada o amamantando, es posible que deba tomar una dosis adicional de hierro, ya que su dieta normal generalmente no proporciona la cantidad necesaria.  Aumentar la ingesta de vitamina C. La vitamina C ayuda al organismo a Research scientist (physical sciences). Es posible que el mdico le recomiende tomar los suplementos de hierro con un vaso de  jugo de naranja o con un suplemento de vitaminaC.  Medicamentos para disminuir el flujo menstrual abundante.  Ciruga. Es posible que deba realizarse varios anlisis de sangre para determinar si el tratamiento da resultado. Si el tratamiento no funciona, es posible que deba realizarse otras pruebas. Siga estas instrucciones en su casa: Medicamentos  Use los medicamentos de venta libre y los recetados solamente como se lo haya indicado el mdico. Esto incluye suplementos de hierro y vitaminas. ? Para lograr una mejor absorcin del hierro, los suplementos de hierro se deben tomar con el estmago vaco. Si no los tolera con el estmago vaco, posiblemente deba tomarlos con la comida. ? No beba leche ni tome anticidos al mismo tiempo que los suplementos de hierro. La leche y los anticidos pueden interferir en la absorcin del hierro. ? Los suplementos de hierro pueden hacer que la materia fecal (heces) se torne de un color ms oscuro y puede ponerse negra.  Si no tolera tomar los suplementos de hierro por boca, hable con su mdico sobre la posibilidad de administrarlos por una va intravenosa o mediante una inyeccin en un msculo. Comida y bebida  Hable con el mdico antes de cambiar la dieta. Podra recomendarle que consuma alimentos que contienen mucho hierro; por ejemplo: ? Hgado. ? Carne de res con bajo contenido de grasa Kara Pacer). ? Panes y cereales con hierro agregado (fortificados). ? Huevos. ? Frutas pasas. ? Verduras de hojas color verde oscuro.  Para ayudar al organismo a aprovechar el hierro de los alimentos con alto contenido de hierro, Control and instrumentation engineer dichos alimentos al mismo tiempo que frutas y verduras frescas que tengan mucha vitaminaC. Algunos alimentos ricos en [place] son los siguientes: ? Naranjas. ? Pimientos. ? Tomates. ? Mangos.  Beba suficiente lquido como para Theatre manager la orina de color amarillo plido.   Control del estreimiento Si toma un suplemento de  hierro, este puede causarle estreimiento. Para prevenir o tratar el estreimiento, es posible que deba hacer lo siguiente:  Usar medicamentos recetados o de Radio broadcast assistant.  Consumir alimentos ricos en fibra, como frijoles, cereales integrales, y frutas y verduras frescas.  Limitar el consumo de alimentos ricos en grasa y azcares procesados, como los alimentos fritos o dulces. Instrucciones generales  Retome sus actividades normales segn lo indicado por el mdico. Pregntele al mdico qu actividades son seguras para usted.  Mantenga una buena higiene. La anemia puede hacerlo ms propenso a contraer enfermedades e infecciones.  Concurra a todas las visitas de seguimiento como se lo haya indicado el mdico. Esto es importante. Comunquese con un mdico si:  Siente nuseas o vomita.  Se sienta dbil.  Comienza a sudar sin motivo.  Presenta sntomas de estreimiento; por ejemplo: ? Defecar menos de tres veces por semana. ? Hacer un gran esfuerzo para defecar. ? Tener las heces secas y duras, o ms grandes que lo normal. ? Tener la sensacin de estar lleno o hinchado. ? Dolor en la parte baja del abdomen. ? No sentir alivio despus de defecar. Busque ayuda de inmediato si:  Se desmaya. Si esto sucede, no conduzca por sus propios medios [facility] hospital.  Siente dolor en  el pecho.  Tiene falta el aire con las siguientes caractersticas: ? Es intensa. ? Empeora con la actividad fsica.  Tiene latidos cardacos rpidos o irregulares.  Se siente mareado cuando se para despus de estar sentado o acostado. Estos sntomas pueden representar un problema grave que constituye Engineer, maintenance (IT). No espere a ver si los sntomas desaparecen. Solicite atencin mdica de inmediato. Comunquese con el servicio de emergencias de su localidad (911 en los [place]). No conduzca por sus propios medios [facility] hospital. Resumen  La anemia por deficiencia de hierro es una afeccin en la que  la concentracin de glbulos rojos o hemoglobina en la sangre est por debajo de lo normal debido a la falta de hierro.  Esta afeccin se trata remediando la causa de la deficiencia de hierro.  Use los medicamentos de venta libre y los recetados solamente como se lo haya indicado el mdico. Esto incluye suplementos de hierro y vitaminas.  Para ayudar al organismo a aprovechar el hierro de los alimentos con alto contenido de hierro, Control and instrumentation engineer dichos alimentos al mismo tiempo que frutas y verduras frescas que tengan mucha vitaminaC.  Solicite ayuda de inmediato si tiene falta de aire que empeora con la actividad fsica. Esta informacin no tiene Marine scientist el consejo del mdico. Asegrese de hacerle al mdico cualquier pregunta que tenga. Document Revised: 11/05/2019 Document Reviewed: 11/05/2019 Elsevier Patient Education  2021 [employer].
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
## 2021-03-07 ENCOUNTER — Telehealth: Payer: Self-pay | Admitting: *Deleted

## 2021-03-07 ENCOUNTER — Other Ambulatory Visit: Payer: Self-pay | Admitting: Physician Assistant

## 2021-03-07 LAB — CBC WITH DIFFERENTIAL/PLATELET
Basophils Absolute: 0 10*3/uL (ref 0.0–0.2)
Basos: 1 %
EOS (ABSOLUTE): 0.1 10*3/uL (ref 0.0–0.4)
Eos: 3 %
Hematocrit: 29.6 % — ABNORMAL LOW (ref 34.0–46.6)
Hemoglobin: 8.3 g/dL — ABNORMAL LOW (ref 11.1–15.9)
Immature Grans (Abs): 0 10*3/uL (ref 0.0–0.1)
Immature Granulocytes: 0 %
Lymphocytes Absolute: 1.5 10*3/uL (ref 0.7–3.1)
Lymphs: 28 %
MCH: 19.4 pg — ABNORMAL LOW (ref 26.6–33.0)
MCHC: 28 g/dL — ABNORMAL LOW (ref 31.5–35.7)
MCV: 69 fL — ABNORMAL LOW (ref 79–97)
Monocytes Absolute: 0.5 10*3/uL (ref 0.1–0.9)
Monocytes: 9 %
Neutrophils Absolute: 3.2 10*3/uL (ref 1.4–7.0)
Neutrophils: 59 %
Platelets: 412 10*3/uL (ref 150–450)
RBC: 4.27 x10E6/uL (ref 3.77–5.28)
RDW: 17.2 % — ABNORMAL HIGH (ref 11.7–15.4)
WBC: 5.4 10*3/uL (ref 3.4–10.8)

## 2021-03-07 LAB — IRON,TIBC AND FERRITIN PANEL
Ferritin: 3 ng/mL — ABNORMAL LOW (ref 15–150)
Iron Saturation: 3 % — CL (ref 15–55)
Iron: 13 ug/dL — ABNORMAL LOW (ref 27–159)
Total Iron Binding Capacity: 429 ug/dL (ref 250–450)
UIBC: 416 ug/dL (ref 131–425)

## 2021-03-07 LAB — COMP. METABOLIC PANEL (12)
AST: 12 IU/L (ref 0–40)
Albumin/Globulin Ratio: 1.6 (ref 1.2–2.2)
Albumin: 4.2 g/dL (ref 3.8–4.8)
Alkaline Phosphatase: 83 IU/L (ref 44–121)
BUN/Creatinine Ratio: 20 (ref 9–23)
BUN: 12 mg/dL (ref 6–24)
Bilirubin Total: 0.2 mg/dL (ref 0.0–1.2)
Calcium: 8.5 mg/dL — ABNORMAL LOW (ref 8.7–10.2)
Chloride: 105 mmol/L (ref 96–106)
Creatinine, Ser: 0.61 mg/dL (ref 0.57–1.00)
Globulin, Total: 2.6 g/dL (ref 1.5–4.5)
Glucose: 85 mg/dL (ref 65–99)
Potassium: 4.1 mmol/L (ref 3.5–5.2)
Sodium: 139 mmol/L (ref 134–144)
Total Protein: 6.8 g/dL (ref 6.0–8.5)
eGFR: 110 mL/min/{1.73_m2} (ref >59)

## 2021-03-07 LAB — VITAMIN D 25 HYDROXY (VIT D DEFICIENCY, FRACTURES): Vit D, 25-Hydroxy: 16.7 ng/mL — ABNORMAL LOW (ref 30.0–100.0)

## 2021-03-07 MED ORDER — VITAMIN D (ERGOCALCIFEROL) 1.25 MG (50000 UNIT) PO CAPS: 50000.0000 [IU] | ORAL_CAPSULE | ORAL | 2 refills | Status: DC

## 2021-03-07 MED FILL — VIT D2 1.25 MG (50,000 UNIT: 1.25 MG | 28 days supply | Qty: 4 | Fill #0

## 2021-03-07 NOTE — Telephone Encounter (Signed)
Medical Assistant used [employer] to contact patient.  Interpreter Name: Maretta Bees Interpreter #: [phone] Patient verified DOB Patient is aware of labs showing severely low iron and vitamin d levels. Patient advised to take 1 tablet of iron for 1 week and increase to 2 tablets BID if tolerated. Patient is also aware taking vitamin d 50,000 unit supplement 1 a week with a recheck in 6 weeks for iron and 12 weeks for vitamin d.

## 2021-03-07 NOTE — Addendum Note (Signed)
Addended by: Kennieth Rad on: 03/07/2021 09:56 AM   Modules accepted: Orders

## 2021-03-07 NOTE — Telephone Encounter (Signed)
-----   Message from Kennieth Rad, [place] sent at 03/07/2021  9:56 AM EDT ----- Please call patient and her know that her iron levels are severely low, it is imperative that she start iron supplementation while she needs to take 1 tablet once a day for 1 week then increase to 1 tablet twice a day.  She needs to have these levels rechecked in 6 weeks.  Her vitamin D levels are low, she needs to take 50,000 units once a week for at least the next 12 weeks. I sent vit d prescription to her pharmacy. (iron was sent yesterday during [place])

## 2021-04-10 ENCOUNTER — Other Ambulatory Visit: Payer: Self-pay

## 2021-04-10 MED FILL — Hydroxyzine Pamoate Cap 25 MG: ORAL | 30 days supply | Qty: 120 | Fill #0 | Status: AC

## 2021-04-10 MED FILL — Quetiapine Fumarate Tab 25 MG: ORAL | 30 days supply | Qty: 30 | Fill #0 | Status: AC

## 2021-04-19 ENCOUNTER — Ambulatory Visit: Payer: No Typology Code available for payment source | Attending: Internal Medicine

## 2021-04-19 ENCOUNTER — Other Ambulatory Visit: Payer: Self-pay

## 2021-04-19 ENCOUNTER — Other Ambulatory Visit (HOSPITAL_BASED_OUTPATIENT_CLINIC_OR_DEPARTMENT_OTHER): Payer: Self-pay

## 2021-04-19 DIAGNOSIS — Z23 Encounter for immunization: Secondary | ICD-10-CM

## 2021-04-19 MED ORDER — COVID-19 MRNA VACC (MODERNA) 100 MCG/0.5ML IM SUSP
INTRAMUSCULAR | 0 refills | Status: AC
  Filled 2021-04-19: qty 0.25, 1d supply, fill #0

## 2021-04-19 NOTE — Progress Notes (Signed)
   Covid-19 Vaccination Clinic  Name:  Gina Burton    MRN: [id number] DOB: 1971-07-31  04/19/2021  Ms. Gina Burton was observed post Covid-19 immunization for 15 minutes without incident. She was provided with Vaccine Information Sheet and instruction to access the V-Safe system.   Ms. Gina Burton was instructed to call 911 with any severe reactions post vaccine: Marland Kitchen Difficulty breathing  . Swelling of face and throat  . A fast heartbeat  . A bad rash all over body  . Dizziness and weakness   Immunizations Administered    Name Date Dose VIS Date Route   Moderna Covid-19 Booster Vaccine 04/19/2021 12:43 PM 0.25 mL 10/05/2020 Intramuscular   Manufacturer: Moderna   Lot: [id number]   [place]: [id number]

## 2021-06-13 ENCOUNTER — Ambulatory Visit: Payer: Self-pay | Attending: Internal Medicine | Admitting: Internal Medicine

## 2021-06-13 ENCOUNTER — Other Ambulatory Visit: Payer: Self-pay

## 2021-06-13 ENCOUNTER — Telehealth: Payer: Self-pay | Admitting: Clinical

## 2021-06-13 ENCOUNTER — Encounter: Payer: Self-pay | Admitting: Internal Medicine

## 2021-06-13 VITALS — BP 117/85 | HR 75 | Resp 16 | Ht 64.5 in | Wt 139.4 lb

## 2021-06-13 DIAGNOSIS — Z8659 Personal history of other mental and behavioral disorders: Secondary | ICD-10-CM

## 2021-06-13 DIAGNOSIS — D500 Iron deficiency anemia secondary to blood loss (chronic): Secondary | ICD-10-CM

## 2021-06-13 DIAGNOSIS — N921 Excessive and frequent menstruation with irregular cycle: Secondary | ICD-10-CM

## 2021-06-13 DIAGNOSIS — F322 Major depressive disorder, single episode, severe without psychotic features: Secondary | ICD-10-CM

## 2021-06-13 DIAGNOSIS — R45851 Suicidal ideations: Secondary | ICD-10-CM

## 2021-06-13 DIAGNOSIS — F411 Generalized anxiety disorder: Secondary | ICD-10-CM

## 2021-06-13 MED ORDER — QUETIAPINE FUMARATE 25 MG PO TABS
ORAL_TABLET | Freq: Every day | ORAL | 1 refills | Status: DC
  Filled 2021-06-13: qty 30, 30d supply, fill #0

## 2021-06-13 MED ORDER — HYDROXYZINE PAMOATE 25 MG PO CAPS
ORAL_CAPSULE | ORAL | 1 refills | Status: DC
  Filled 2021-06-13: qty 120, 30d supply, fill #0

## 2021-06-13 MED ORDER — FERROUS SULFATE 325 (65 FE) MG PO TABS
ORAL_TABLET | ORAL | 2 refills | Status: DC
  Filled 2021-06-13: qty 60, 30d supply, fill #0

## 2021-06-13 NOTE — Progress Notes (Signed)
Patient ID: Gina Burton, female    DOB: March 17, 1971  MRN: [id number]  CC: Anxiety and Medication Refill   Subjective: Gina Burton is a 50 y.o. female who presents for chronic ds management.  Interpreter, Dub Mikes, from [facility] is with her and interprets. Her concerns today include:  Patient with history of bipolar disorder, GAD, iron deficiency anemia, vitamin D deficiency, tobacco dependence  Patient was last seen by PA Cari  Mayers 02/2021 at [facility] Requesting RF on Seroquel and Hydroxyzine today.  Out x 2 mths. Endorses issues with depression and anxiety.  Having issues with her daughter who is going through a hard time.  Sometimes pt has thoughts that she would be better of dead.  Thinks of jumping off a bridge; "it would be the easiest thing to do."  Admits to visual hallucinations at times where she sees pt jumping off cars. Hears voices at times in her room. Voices tell her she is good for nothing.   -admits to past suicide attempt 23 yrs ago. Cut self on abdomen with blades requiring 45 stiches.  Mental Health admission in [place] in 2012.   She is currently not plugged into [facility] services  IDA:  Reports excessive tiredness.  Was taking iron supplement BID but out x 1 mth.  Still has menstrual cycles.  Menses irregular x 6 mths.  Sometimes can get cycles 3 x in 1 mth.  Bleeding heavy and very painful with clots.   Patient Active Problem List   Diagnosis Date Noted   GAD (generalized anxiety disorder) 03/06/2021   Rib pain 03/06/2021   Psychophysiological insomnia 03/06/2021   Closed fracture of one rib of left side 03/06/2021   Vitamin D deficiency 03/06/2021   Absolute anemia 03/06/2021   Hypokalemia 03/06/2021   Major depression, chronic 09/15/2020   Bipolar disorder, current episode hypomanic ([place])    Asthma      Current Outpatient Medications on File Prior to Visit  Medication Sig Dispense Refill   celecoxib (CELEBREX) 100 MG capsule Take 1 capsule  (100 mg total) by mouth daily. 15 capsule 0   COVID-19 mRNA vaccine, Moderna, 100 MCG/0.5ML injection Inject into the muscle. 0.25 mL 0   ferrous sulfate 325 (65 FE) MG tablet TAKE 1 EACH BY MOUTH IN THE MORNING AND AT BEDTIME. 60 tablet 2   hydrOXYzine (VISTARIL) 25 MG capsule TAKE 2 CAPSULES (50 MG TOTAL) BY MOUTH 2 (TWO) TIMES DAILY. 120 capsule 1   Iron, Ferrous Sulfate, 325 (65 Fe) MG TABS Take 1 each by mouth in the morning and at bedtime. 60 tablet 2   Lidocaine 4 % PTCH APPLY TO LEFT CHEST WALL ONCE DAILY. APPLY AT 8AM AND REMOVE AT 8PM. 15 patch 0   lithium 300 MG tablet 1 tab by mouth twice daily. 60 tablet 1   QUEtiapine (SEROQUEL) 25 MG tablet TAKE 1 TABLET BY MOUTH AT BEDTIME 30 tablet 1   Vitamin D, Ergocalciferol, (DRISDOL) 1.25 MG (50000 UNIT) CAPS capsule TAKE 1 CAPSULE (50,000 UNITS TOTAL) BY MOUTH EVERY 7 (SEVEN) DAYS. 4 capsule 2   No current facility-administered medications on file prior to visit.    No Known Allergies  Social History   Socioeconomic History   Marital status: Domestic Partner    Spouse name: Braulio Conte   Number of children: 4   Years of education: Not on file   Highest education level: Not on file  Occupational History   Not on file  Tobacco Use  Smoking status: Every Day    Packs/day: 0.50    Pack years: 0.00    Types: Cigarettes   Smokeless tobacco: Never  Substance and Sexual Activity   Alcohol use: No   Drug use: No   Sexual activity: Not Currently  Other Topics Concern   Not on file  Social History Narrative   Lives with a boyfriend on and off for past year.   Her boyfriend travels a lot and she believes he has other relationships with other women.   No others live in the same home, though she feels the neighbors do not like her.     History of trauma   Her middle son and a friend in particular caused significant trauma when she lived in [place]    Social Determinants of Health   Financial Resource Strain: Not on file  Food  Insecurity: Not on file  Transportation Needs: Not on file  Physical Activity: Not on file  Stress: Not on file  Social Connections: Not on file  Intimate Partner Violence: Not on file    Family History  Problem Relation Age of Onset   Cancer Mother        Possibly Esophageal   Asthma Mother    COPD Mother        smoker   Bipolar disorder Mother        Possibly as well.  Not clear if diagnosed   Seizures Sister     Past Surgical History:  Procedure Laterality Date   arm surgery     CHOLECYSTECTOMY  1997    ROS: Review of Systems Negative except as stated above  PHYSICAL EXAM: BP 117/85   Pulse 75   Resp 16   Ht 5' 4.5" (1.638 m)   Wt 139 lb 6.4 oz (63.2 kg)   SpO2 96%   BMI 23.56 kg/m   Physical Exam  General appearance - alert, well appearing, and in no distress Mental status - flat affect. Good eye contact.  Answers questions appropriately. Neck - supple, no significant adenopathy Chest - clear to auscultation, no wheezes, rales or rhonchi, symmetric air entry Heart - normal rate, regular rhythm, normal S1, S2, no murmurs, rubs, clicks or gallops Extremities - peripheral pulses normal, no pedal edema, no clubbing or cyanosis  GAD 7 : Generalized Anxiety Score 06/13/2021 03/06/2021 12/29/2020 09/26/2020  Nervous, Anxious, on Edge 3 3 3 3   Control/stop worrying 3 3 3 3   Worry too much - different things 3 3 3 3   Trouble relaxing 3 3 3 3   Restless 3 3 3 3   Easily annoyed or irritable 3 1 3 3   Afraid - awful might happen 3 3 3 3   Total GAD 7 Score 21 19 21 21     Depression screen [facility] 2/9 06/13/2021 03/06/2021 12/29/2020  Decreased Interest 3 3 0  Down, Depressed, Hopeless 3 3 0  PHQ - 2 Score 6 6 0  Altered sleeping 3 3 -  Tired, decreased energy 3 3 -  Change in appetite 3 3 -  Feeling bad or failure about yourself  3 3 -  Trouble concentrating 3 3 -  Moving slowly or fidgety/restless 3 3 -  Suicidal thoughts 2 3 -  PHQ-9 Score 26 27 -     CMP Latest  Ref Rng & Units 03/06/2021 12/06/2020 12/06/2020  Glucose 65 - 99 mg/dL 85 98 104(H)  BUN 6 - 24 mg/dL 12 19 14   Creatinine 0.57 - 1.00 mg/dL 0.61  0.50 0.68  Sodium 134 - 144 mmol/L 139 139 138  Potassium 3.5 - 5.2 mmol/L 4.1 2.9(L) 2.9(L)  Chloride 96 - 106 mmol/L 105 104 104  CO2 22 - 32 mmol/L - - 21(L)  Calcium 8.7 - 10.2 mg/dL 8.5(L) - 8.5(L)  Total Protein 6.0 - 8.5 g/dL 6.8 - 7.0  Total Bilirubin 0.0 - 1.2 mg/dL 0.2 - 0.5  Alkaline Phos 44 - 121 IU/L 83 - 75  AST 0 - 40 IU/L 12 - 21  ALT 0 - 44 U/L - - 15   Lipid Panel     Component Value Date/Time   CHOL 158 09/08/2020 0935   TRIG 52 09/08/2020 0935   HDL 66 09/08/2020 0935   CHOLHDL 2.4 09/08/2020 0935   LDLCALC 81 09/08/2020 0935    CBC    Component Value Date/Time   WBC 5.4 03/06/2021 1024   WBC 7.4 12/06/2020 1544   RBC 4.27 03/06/2021 1024   RBC 4.27 12/06/2020 1544   HGB 8.3 (L) 03/06/2021 1024   HCT 29.6 (L) 03/06/2021 1024   PLT 412 03/06/2021 1024   MCV 69 (L) 03/06/2021 1024   MCH 19.4 (L) 03/06/2021 1024   MCH 19.7 (L) 12/06/2020 1544   MCHC 28.0 (L) 03/06/2021 1024   MCHC 28.7 (L) 12/06/2020 1544   RDW 17.2 (H) 03/06/2021 1024   LYMPHSABS 1.5 03/06/2021 1024   MONOABS 0.6 02/06/2017 2116   EOSABS 0.1 03/06/2021 1024   BASOSABS 0.0 03/06/2021 1024    ASSESSMENT AND PLAN: 1. Suicidal ideation 2. Major depressive disorder, severe ([place]) 3. History of bipolar disorder 4. GAD (generalized anxiety disorder) -Patient with active plan of self-harm.  I recommend that she be seen in the emergency room for further evaluation and management.  Patient agreeable to being seen in the emergency room today.  I will print my discharge summary and send it with her.  Refills on Seroquel and hydroxyzine sent to the pharmacy.  She is agreeable to referral to behavioral health. - QUEtiapine (SEROQUEL) 25 MG tablet; TAKE 1 TABLET BY MOUTH AT BEDTIME  Dispense: 30 tablet; Refill: 1 - hydrOXYzine (VISTARIL) 25 MG  capsule; TAKE 2 CAPSULES (50 MG TOTAL) BY MOUTH 2 (TWO) TIMES DAILY.  Dispense: 120 capsule; Refill: 1 - Ambulatory referral to Psychiatry  5. Iron deficiency anemia due to chronic blood loss -Refill iron. As part of her work-up for the iron deficiency, we will get a pelvic ultrasound to evaluate for fibroids given symptoms of dysmenorrhea and menorrhagia with irregular cycle.  She will follow-up with me in 1 month for Pap smear. - ferrous sulfate 325 (65 FE) MG tablet; TAKE 1 EACH BY MOUTH IN THE MORNING AND AT BEDTIME.  Dispense: 60 tablet; Refill: 2  6. Menorrhagia with irregular cycle See plan above. - US Pelvic Complete With Transvaginal; Future  At the end of the visit patient complained of joint aches and feeling like her muscles are weak.  Due to time restraints, we will address on subsequent visit.  Patient was given the opportunity to ask questions.  Patient verbalized understanding of the plan and was able to repeat key elements of the plan.   No orders of the defined types were placed in this encounter.    Requested Prescriptions    No prescriptions requested or ordered in this encounter    No follow-ups on file.  Karle Plumber, MD, FACP

## 2021-06-13 NOTE — Patient Instructions (Addendum)
Please be seen in the emergency room given your severe depression with suicidal plans.  Take the iron supplement daily.

## 2021-06-22 ENCOUNTER — Ambulatory Visit (HOSPITAL_COMMUNITY)
Admission: RE | Admit: 2021-06-22 | Discharge: 2021-06-22 | Disposition: A | Discharge status: Home / Self Care | Payer: Self-pay | Source: Ambulatory Visit | Attending: Internal Medicine | Admitting: Internal Medicine

## 2021-06-22 ENCOUNTER — Other Ambulatory Visit: Payer: Self-pay | Admitting: Internal Medicine

## 2021-06-22 ENCOUNTER — Other Ambulatory Visit: Payer: Self-pay

## 2021-06-22 DIAGNOSIS — D500 Iron deficiency anemia secondary to blood loss (chronic): Secondary | ICD-10-CM

## 2021-06-22 DIAGNOSIS — D219 Benign neoplasm of connective and other soft tissue, unspecified: Secondary | ICD-10-CM

## 2021-06-22 DIAGNOSIS — N921 Excessive and frequent menstruation with irregular cycle: Secondary | ICD-10-CM

## 2021-06-22 NOTE — Progress Notes (Signed)
Let patient know that the pelvic ultrasound shows that she has fibroids in the uterus and this is most likely the cause of the irregular heavy periods.  I have referred her to gynecology.

## 2021-06-23 ENCOUNTER — Telehealth: Payer: Self-pay

## 2021-06-23 NOTE — Telephone Encounter (Signed)
Contacted pt to go over lab results number on pt profile is the wrong number

## 2021-06-30 NOTE — Telephone Encounter (Signed)
Close  

## 2021-10-26 ENCOUNTER — Other Ambulatory Visit: Payer: Self-pay | Admitting: Internal Medicine

## 2021-10-26 ENCOUNTER — Ambulatory Visit: Payer: Self-pay | Attending: Internal Medicine | Admitting: Internal Medicine

## 2021-10-26 ENCOUNTER — Other Ambulatory Visit: Payer: Self-pay

## 2021-10-26 ENCOUNTER — Encounter: Payer: Self-pay | Admitting: Internal Medicine

## 2021-10-26 VITALS — BP 117/75 | HR 80 | Resp 16 | Wt 149.0 lb

## 2021-10-26 DIAGNOSIS — F322 Major depressive disorder, single episode, severe without psychotic features: Secondary | ICD-10-CM

## 2021-10-26 DIAGNOSIS — M25521 Pain in right elbow: Secondary | ICD-10-CM

## 2021-10-26 DIAGNOSIS — F411 Generalized anxiety disorder: Secondary | ICD-10-CM

## 2021-10-26 DIAGNOSIS — Z23 Encounter for immunization: Secondary | ICD-10-CM

## 2021-10-26 DIAGNOSIS — Z1211 Encounter for screening for malignant neoplasm of colon: Secondary | ICD-10-CM

## 2021-10-26 DIAGNOSIS — Z114 Encounter for screening for human immunodeficiency virus [HIV]: Secondary | ICD-10-CM

## 2021-10-26 DIAGNOSIS — Z8659 Personal history of other mental and behavioral disorders: Secondary | ICD-10-CM

## 2021-10-26 DIAGNOSIS — J4520 Mild intermittent asthma, uncomplicated: Secondary | ICD-10-CM

## 2021-10-26 DIAGNOSIS — D219 Benign neoplasm of connective and other soft tissue, unspecified: Secondary | ICD-10-CM

## 2021-10-26 DIAGNOSIS — Z1159 Encounter for screening for other viral diseases: Secondary | ICD-10-CM

## 2021-10-26 MED ORDER — ALBUTEROL SULFATE HFA 108 (90 BASE) MCG/ACT IN AERS
2.0000 | INHALATION_SPRAY | Freq: Four times a day (QID) | RESPIRATORY_TRACT | 2 refills | Status: DC | PRN
  Filled 2021-10-26: qty 8.5, 25d supply, fill #0

## 2021-10-26 MED ORDER — ALBUTEROL SULFATE HFA 108 (90 BASE) MCG/ACT IN AERS
2.0000 | INHALATION_SPRAY | Freq: Four times a day (QID) | RESPIRATORY_TRACT | 2 refills | Status: DC | PRN
  Filled 2021-10-26: qty 8, fill #0

## 2021-10-26 MED ORDER — HYDROXYZINE PAMOATE 25 MG PO CAPS
ORAL_CAPSULE | ORAL | 1 refills | Status: DC
  Filled 2021-10-26: qty 120, 30d supply, fill #0
  Filled 2021-12-04: qty 120, 30d supply, fill #1

## 2021-10-26 MED ORDER — QUETIAPINE FUMARATE 25 MG PO TABS
ORAL_TABLET | Freq: Every day | ORAL | 3 refills | Status: DC
Start: 2021-10-26 — End: 2022-02-08
  Filled 2021-10-26: qty 30, 30d supply, fill #0
  Filled 2021-12-04: qty 30, 30d supply, fill #1

## 2021-10-26 MED ORDER — IBUPROFEN 600 MG PO TABS
600.0000 mg | ORAL_TABLET | Freq: Three times a day (TID) | ORAL | 1 refills | Status: DC | PRN
  Filled 2021-10-26: qty 30, 10d supply, fill #0

## 2021-10-26 NOTE — Progress Notes (Signed)
Pt states she would like her right arm looked at. Pt states her arm has been giving her some discomort

## 2021-10-26 NOTE — Progress Notes (Signed)
Patient ID: Gina Burton, female    DOB: 06-08-1971  MRN: [id number]  CC: Medication Refill   Subjective: Gina Burton is a 50 y.o. female who presents for chronic ds management Her concerns today include:  Patient with history of bipolar disorder, GAD, MDD (hx of suicide attempt), IDA d/t menorrhagia with fibroids, vitamin D deficiency, tob dep  GAD/MDD/Bipolar:  request RF on meds which are Seroquel and hydroxyzine..  Out x several mths Wants to see psychiatrist.  Feels her anxiety getting worse. Informed pt that we referred to [facility] on last visit but no one was able to reach her via phone or by address as both information in the system are no longer current.  She tells me that her new phone number is [phone]. -She was referred to ER for SI on last visit but she did not go.  We had sent a low enforcement officer to her house to take her to the emergency room but the address was incorrect.  Today she reports that she still gets suicidal thoughts but she tries to control them.  Reportedly went to a [facility] in [facility] and had tried to set up appt but was unable to  IDA/menorrhagia:  had pelvic US which confirmed uterine fibroids. Referred to GYN but pt's contact information currently in system incorrect.  Still taking iron supplement once a day. Over due for pap  C/o pain in RT arm, more at night Pain concentrated around posterior forearm and around elbow Hx of fracture to forearm 2-3 yrs ago.  Was placed in cast and told to f/u with ortho.  Wore cast for several wks then eventually took it off herself because she could not afford to see specialist. Did well until 4 days ago when a cinder block fell on arm at work.  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  She tells me she works for herself..   She gives history of mild asthma.  She gets a flareup during seasonal changes.  Requesting prescription for albuterol.  HM:  due for flu and Prevnar  20.  Agrees for hep C and HIV screen and colon cancer screening Patient Active Problem List   Diagnosis Date Noted   Menorrhagia with irregular cycle 06/13/2021   GAD (generalized anxiety disorder) 03/06/2021   Rib pain 03/06/2021   Psychophysiological insomnia 03/06/2021   Closed fracture of one rib of left side 03/06/2021   Vitamin D deficiency 03/06/2021   Absolute anemia 03/06/2021   Hypokalemia 03/06/2021   Major depression, chronic 09/15/2020   Bipolar disorder, current episode hypomanic ([place])    Asthma      Current Outpatient Medications on File Prior to Visit  Medication Sig Dispense Refill   COVID-19 mRNA vaccine, Moderna, 100 MCG/0.5ML injection Inject into the muscle. 0.25 mL 0   ferrous sulfate 325 (65 FE) MG tablet TAKE 1 EACH BY MOUTH IN THE MORNING AND AT BEDTIME. 60 tablet 2   Lidocaine 4 % PTCH APPLY TO LEFT CHEST WALL ONCE DAILY. APPLY AT 8AM AND REMOVE AT 8PM. 15 patch 0   Vitamin D, Ergocalciferol, (DRISDOL) 1.25 MG (50000 UNIT) CAPS capsule TAKE 1 CAPSULE (50,000 UNITS TOTAL) BY MOUTH EVERY 7 (SEVEN) DAYS. 4 capsule 2   No current facility-administered medications on file prior to visit.    No Known Allergies  Social History   Socioeconomic History   Marital status: Domestic Partner    Spouse name: Braulio Conte  Number of children: 4   Years of education: Not on file   Highest education level: Not on file  Occupational History   Not on file  Tobacco Use   Smoking status: Every Day    Packs/day: 0.50    Types: Cigarettes   Smokeless tobacco: Never  Substance and Sexual Activity   Alcohol use: No   Drug use: No   Sexual activity: Not Currently  Other Topics Concern   Not on file  Social History Narrative   Lives with a boyfriend on and off for past year.   Her boyfriend travels a lot and she believes he has other relationships with other women.   No others live in the same home, though she feels the neighbors do not like her.     History of trauma    Her middle son and a friend in particular caused significant trauma when she lived in [place]    Social Determinants of Health   Financial Resource Strain: Not on file  Food Insecurity: Not on file  Transportation Needs: Not on file  Physical Activity: Not on file  Stress: Not on file  Social Connections: Not on file  Intimate Partner Violence: Not on file    Family History  Problem Relation Age of Onset   Cancer Mother        Possibly Esophageal   Asthma Mother    COPD Mother        smoker   Bipolar disorder Mother        Possibly as well.  Not clear if diagnosed   Seizures Sister     Past Surgical History:  Procedure Laterality Date   arm surgery     CHOLECYSTECTOMY  1997    ROS: Review of Systems Negative except as stated above  PHYSICAL EXAM: BP 117/75   Pulse 80   Resp 16   Wt 149 lb (67.6 kg)   SpO2 98%   BMI 25.18 kg/m   Physical Exam  General appearance - alert, well appearing, and in no distress Mental status - normal mood, behavior, speech, dress, motor activity, and thought processes Neck - supple, no significant adenopathy Chest - clear to auscultation, no wheezes, rales or rhonchi, symmetric air entry Heart - normal rate, regular rhythm, normal S1, S2, no murmurs, rubs, clicks or gallops Musculoskeletal -right arm: She has some abrasions noted around the cubital fossa.  No edema or ecchymosis noted.  She has mild to moderate tenderness in the muscles of the forearm proximally close to the elbow.  Good range of motion of the elbow.  Depression screen [facility] 2/9 10/26/2021 06/13/2021 03/06/2021  Decreased Interest 3 3 3   Down, Depressed, Hopeless 3 3 3   PHQ - 2 Score 6 6 6   Altered sleeping 3 3 3   Tired, decreased energy 3 3 3   Change in appetite 3 3 3   Feeling bad or failure about yourself  3 3 3   Trouble concentrating 3 3 3   Moving slowly or fidgety/restless 3 3 3   Suicidal thoughts 3 2 3   PHQ-9 Score 27 26 27     CMP Latest Ref Rng &  Units 03/06/2021 12/06/2020 12/06/2020  Glucose 65 - 99 mg/dL 85 98 104(H)  BUN 6 - 24 mg/dL 12 19 14   Creatinine 0.57 - 1.00 mg/dL 0.61 0.50 0.68  Sodium 134 - 144 mmol/L 139 139 138  Potassium 3.5 - 5.2 mmol/L 4.1 2.9(L) 2.9(L)  Chloride 96 - 106 mmol/L 105 104 104  CO2  22 - 32 mmol/L - - 21(L)  Calcium 8.7 - 10.2 mg/dL 8.5(L) - 8.5(L)  Total Protein 6.0 - 8.5 g/dL 6.8 - 7.0  Total Bilirubin 0.0 - 1.2 mg/dL 0.2 - 0.5  Alkaline Phos 44 - 121 IU/L 83 - 75  AST 0 - 40 IU/L 12 - 21  ALT 0 - 44 U/L - - 15   Lipid Panel     Component Value Date/Time   CHOL 158 09/08/2020 0935   TRIG 52 09/08/2020 0935   HDL 66 09/08/2020 0935   CHOLHDL 2.4 09/08/2020 0935   LDLCALC 81 09/08/2020 0935    CBC    Component Value Date/Time   WBC 5.4 03/06/2021 1024   WBC 7.4 12/06/2020 1544   RBC 4.27 03/06/2021 1024   RBC 4.27 12/06/2020 1544   HGB 8.3 (L) 03/06/2021 1024   HCT 29.6 (L) 03/06/2021 1024   PLT 412 03/06/2021 1024   MCV 69 (L) 03/06/2021 1024   MCH 19.4 (L) 03/06/2021 1024   MCH 19.7 (L) 12/06/2020 1544   MCHC 28.0 (L) 03/06/2021 1024   MCHC 28.7 (L) 12/06/2020 1544   RDW 17.2 (H) 03/06/2021 1024   LYMPHSABS 1.5 03/06/2021 1024   MONOABS 0.6 02/06/2017 2116   EOSABS 0.1 03/06/2021 1024   BASOSABS 0.0 03/06/2021 1024    ASSESSMENT AND PLAN: 1. GAD (generalized anxiety disorder) 2. Major depressive disorder, severe ([place])  Message sent to our LCSW with her updated phone number to contact her and help her with getting in with behavioral health.  She denies active suicidal plans at this time.  Advised her that if suicidal thoughts persist or become worse she should be seen in the emergency room. - hydrOXYzine (VISTARIL) 25 MG capsule; take 2 tabs by mouth twice a day as needed for anxiety  Dispense: 120 capsule; Refill: 1 - Ambulatory referral to Psychiatry - QUEtiapine (SEROQUEL) 25 MG tablet; TAKE 1 TABLET BY MOUTH AT BEDTIME  Dispense: 30 tablet; Refill: 3 - Ambulatory  referral to Psychiatry  3. History of bipolar disorder - QUEtiapine (SEROQUEL) 25 MG tablet; TAKE 1 TABLET BY MOUTH AT BEDTIME  Dispense: 30 tablet; Refill: 3 - Ambulatory referral to Psychiatry  4. Fibroids Patient will need to apply for the orange card so that she can be referred to gynecology again.  5. Right elbow pain I do not think she has a fracture.  However I did offer to have her go to radiology to have an x-ray done of the elbow but patient declines. - ibuprofen (ADVIL) 600 MG tablet; Take 1 tablet (600 mg total) by mouth every 8 (eight) hours as needed.  Dispense: 30 tablet; Refill: 1  6. Mild intermittent asthma without complication Prescription sent for albuterol inhaler  7. Need for immunization against influenza - Flu Vaccine QUAD 37mo+IM (Fluarix, Fluzone & Alfiuria Quad PF)  8. Need for vaccination against Streptococcus pneumoniae Patient given Prevnar 20  9. Screening for colon cancer I was supposed to give her the fit test but forgot to order it.  I will do so on her subsequent visit  10. Need for hepatitis C screening test - Hepatitis C Antibody  11. Screening for HIV (human immunodeficiency virus) - HIV Antibody (routine testing w rflx)  Advised patient to stop at the front desk today and update her telephone contact and address.  Patient was given the opportunity to ask questions.  Patient verbalized understanding of the plan and was able to repeat key elements of the plan.  [employer] Language interpreter used during this encounter. #[id number], ED  Orders Placed This Encounter  Procedures   Flu Vaccine QUAD 49mo+IM (Fluarix, Fluzone & Alfiuria Quad PF)   HIV Antibody (routine testing w rflx)   Hepatitis C Antibody   Ambulatory referral to Psychiatry     Requested Prescriptions   Signed Prescriptions Disp Refills   QUEtiapine (SEROQUEL) 25 MG tablet 30 tablet 3    Sig: TAKE 1 TABLET BY MOUTH AT BEDTIME   hydrOXYzine (VISTARIL) 25 MG capsule 120 capsule 1     Sig: take 2 tabs by mouth twice a day as needed for anxiety   albuterol (VENTOLIN HFA) 108 (90 Base) MCG/ACT inhaler 8 g 2    Sig: Inhale 2 puffs into the lungs every 6 (six) hours as needed for wheezing or shortness of breath.   ibuprofen (ADVIL) 600 MG tablet 30 tablet 1    Sig: Take 1 tablet (600 mg total) by mouth every 8 (eight) hours as needed.    Return in about 5 weeks (around 11/30/2021) for for PAP.  Please update phone # and address as current information is incorrect.  Karle Plumber, MD, FACP

## 2021-10-27 ENCOUNTER — Telehealth: Payer: Self-pay

## 2021-10-27 ENCOUNTER — Other Ambulatory Visit: Payer: Self-pay

## 2021-10-27 LAB — HIV ANTIBODY (ROUTINE TESTING W REFLEX): HIV Screen 4th Generation wRfx: NONREACTIVE

## 2021-10-27 LAB — HEPATITIS C ANTIBODY: Hep C Virus Ab: <0.1 s/co ratio (ref 0.0–0.9)

## 2021-10-27 NOTE — Telephone Encounter (Signed)
Contacted pt to go over lab results was unable to reach pt due to phone keep going to busy signal   Sent a CRM and forward labs to NT to give pt labs when they call back

## 2021-11-03 ENCOUNTER — Other Ambulatory Visit: Payer: Self-pay

## 2021-11-20 ENCOUNTER — Telehealth: Payer: Self-pay | Admitting: Clinical

## 2021-11-20 NOTE — Telephone Encounter (Signed)
I attempted to reach pt via [place] interpreter per PCP request, no answer, left vm.

## 2021-11-30 ENCOUNTER — Other Ambulatory Visit: Payer: Self-pay

## 2021-11-30 ENCOUNTER — Ambulatory Visit: Payer: Self-pay | Attending: Internal Medicine | Admitting: Internal Medicine

## 2021-11-30 ENCOUNTER — Encounter: Payer: Self-pay | Admitting: Internal Medicine

## 2021-11-30 VITALS — BP 113/75 | HR 84 | Resp 16 | Wt 145.2 lb

## 2021-11-30 DIAGNOSIS — Z8659 Personal history of other mental and behavioral disorders: Secondary | ICD-10-CM

## 2021-11-30 DIAGNOSIS — N938 Other specified abnormal uterine and vaginal bleeding: Secondary | ICD-10-CM

## 2021-11-30 DIAGNOSIS — J4520 Mild intermittent asthma, uncomplicated: Secondary | ICD-10-CM

## 2021-11-30 DIAGNOSIS — F322 Major depressive disorder, single episode, severe without psychotic features: Secondary | ICD-10-CM

## 2021-11-30 MED ORDER — ALBUTEROL SULFATE HFA 108 (90 BASE) MCG/ACT IN AERS
2.0000 | INHALATION_SPRAY | Freq: Four times a day (QID) | RESPIRATORY_TRACT | 2 refills | Status: AC | PRN
  Filled 2021-11-30: qty 8.5, 25d supply, fill #0

## 2021-11-30 NOTE — Progress Notes (Signed)
Pt states she was also here for a PAP but is on period. She also states that she bleeds for the whole month sometimes.
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
## 2021-12-04 ENCOUNTER — Other Ambulatory Visit: Payer: Self-pay

## 2021-12-12 ENCOUNTER — Other Ambulatory Visit: Payer: Self-pay

## 2022-01-31 ENCOUNTER — Emergency Department (HOSPITAL_COMMUNITY): Payer: Self-pay

## 2022-01-31 ENCOUNTER — Emergency Department (HOSPITAL_COMMUNITY)
Admission: EM | Admit: 2022-01-31 | Discharge: 2022-02-01 | Disposition: A | Discharge status: Home / Self Care | Payer: Self-pay | Attending: Emergency Medicine | Admitting: Emergency Medicine

## 2022-01-31 ENCOUNTER — Other Ambulatory Visit: Payer: Self-pay

## 2022-01-31 DIAGNOSIS — Z20822 Contact with and (suspected) exposure to covid-19: Secondary | ICD-10-CM | POA: Insufficient documentation

## 2022-01-31 DIAGNOSIS — R0602 Shortness of breath: Secondary | ICD-10-CM | POA: Insufficient documentation

## 2022-01-31 DIAGNOSIS — R000 Tachycardia, unspecified: Secondary | ICD-10-CM | POA: Insufficient documentation

## 2022-01-31 DIAGNOSIS — R45851 Suicidal ideations: Secondary | ICD-10-CM | POA: Insufficient documentation

## 2022-01-31 DIAGNOSIS — R202 Paresthesia of skin: Secondary | ICD-10-CM | POA: Insufficient documentation

## 2022-01-31 DIAGNOSIS — R079 Chest pain, unspecified: Secondary | ICD-10-CM | POA: Insufficient documentation

## 2022-01-31 DIAGNOSIS — N9489 Other specified conditions associated with female genital organs and menstrual cycle: Secondary | ICD-10-CM | POA: Insufficient documentation

## 2022-01-31 DIAGNOSIS — F411 Generalized anxiety disorder: Secondary | ICD-10-CM | POA: Insufficient documentation

## 2022-01-31 DIAGNOSIS — F331 Major depressive disorder, recurrent, moderate: Secondary | ICD-10-CM | POA: Insufficient documentation

## 2022-01-31 DIAGNOSIS — F329 Major depressive disorder, single episode, unspecified: Secondary | ICD-10-CM | POA: Diagnosis present

## 2022-01-31 DIAGNOSIS — F4321 Adjustment disorder with depressed mood: Secondary | ICD-10-CM | POA: Insufficient documentation

## 2022-01-31 LAB — COMPREHENSIVE METABOLIC PANEL
ALT: 13 U/L (ref 0–44)
AST: 16 U/L (ref 15–41)
Albumin: 3.5 g/dL (ref 3.5–5.0)
Alkaline Phosphatase: 92 U/L (ref 38–126)
Anion gap: 11 (ref 5–15)
BUN: 13 mg/dL (ref 6–20)
CO2: 21 mmol/L — ABNORMAL LOW (ref 22–32)
Calcium: 8.9 mg/dL (ref 8.9–10.3)
Chloride: 105 mmol/L (ref 98–111)
Creatinine, Ser: 0.65 mg/dL (ref 0.44–1.00)
GFR, Estimated: >60 mL/min (ref >60)
Glucose, Bld: 90 mg/dL (ref 70–99)
Potassium: 3.5 mmol/L (ref 3.5–5.1)
Sodium: 137 mmol/L (ref 135–145)
Total Bilirubin: 0.4 mg/dL (ref 0.3–1.2)
Total Protein: 6.8 g/dL (ref 6.5–8.1)

## 2022-01-31 LAB — RAPID URINE DRUG SCREEN, HOSP PERFORMED
Amphetamines: NOT DETECTED
Barbiturates: NOT DETECTED
Benzodiazepines: NOT DETECTED
Cocaine: NOT DETECTED
Opiates: NOT DETECTED
Tetrahydrocannabinol: NOT DETECTED

## 2022-01-31 LAB — URINALYSIS, ROUTINE W REFLEX MICROSCOPIC
Bilirubin Urine: NEGATIVE
Glucose, UA: NEGATIVE mg/dL
Ketones, ur: 20 mg/dL — AB
Nitrite: NEGATIVE
Protein, ur: NEGATIVE mg/dL
Specific Gravity, Urine: 1.013 (ref 1.005–1.030)
pH: 5 (ref 5.0–8.0)

## 2022-01-31 LAB — CBC WITH DIFFERENTIAL/PLATELET
Abs Immature Granulocytes: 0.03 10*3/uL (ref 0.00–0.07)
Basophils Absolute: 0.1 10*3/uL (ref 0.0–0.1)
Basophils Relative: 1 %
Eosinophils Absolute: 0.3 10*3/uL (ref 0.0–0.5)
Eosinophils Relative: 3 %
HCT: 30.9 % — ABNORMAL LOW (ref 36.0–46.0)
Hemoglobin: 9.1 g/dL — ABNORMAL LOW (ref 12.0–15.0)
Immature Granulocytes: 0 %
Lymphocytes Relative: 10 %
Lymphs Abs: 1 10*3/uL (ref 0.7–4.0)
MCH: 20.4 pg — ABNORMAL LOW (ref 26.0–34.0)
MCHC: 29.4 g/dL — ABNORMAL LOW (ref 30.0–36.0)
MCV: 69.4 fL — ABNORMAL LOW (ref 80.0–100.0)
Monocytes Absolute: 0.8 10*3/uL (ref 0.1–1.0)
Monocytes Relative: 8 %
Neutro Abs: 8.4 10*3/uL — ABNORMAL HIGH (ref 1.7–7.7)
Neutrophils Relative %: 78 %
Platelets: 287 10*3/uL (ref 150–400)
RBC: 4.45 MIL/uL (ref 3.87–5.11)
RDW: 20.1 % — ABNORMAL HIGH (ref 11.5–15.5)
WBC: 10.6 10*3/uL — ABNORMAL HIGH (ref 4.0–10.5)
nRBC: 0 % (ref 0.0–0.2)

## 2022-01-31 LAB — TROPONIN I (HIGH SENSITIVITY)
Troponin I (High Sensitivity): 3 ng/L (ref <18)
Troponin I (High Sensitivity): 3 ng/L (ref <18)

## 2022-01-31 LAB — I-STAT BETA HCG BLOOD, ED (MC, WL, AP ONLY): I-stat hCG, quantitative: <5 m[IU]/mL (ref <5)

## 2022-01-31 LAB — RESP PANEL BY RT-PCR (FLU A&B, COVID) ARPGX2
Influenza A by PCR: NEGATIVE
Influenza B by PCR: NEGATIVE
SARS Coronavirus 2 by RT PCR: NEGATIVE

## 2022-01-31 LAB — ETHANOL: Alcohol, Ethyl (B): <10 mg/dL (ref <10)

## 2022-01-31 LAB — PREGNANCY, URINE: Preg Test, Ur: NEGATIVE

## 2022-01-31 MED ORDER — ACETAMINOPHEN 325 MG PO TABS
650.0000 mg | ORAL_TABLET | Freq: Once | ORAL | Status: AC
  Administered 2022-01-31: 650 mg via ORAL
  Filled 2022-01-31: qty 2

## 2022-01-31 MED ORDER — ALBUTEROL SULFATE HFA 108 (90 BASE) MCG/ACT IN AERS: 1.0000 | INHALATION_SPRAY | Freq: Once | RESPIRATORY_TRACT | Status: DC | PRN

## 2022-01-31 MED ORDER — QUETIAPINE FUMARATE 25 MG PO TABS
25.0000 mg | ORAL_TABLET | Freq: Every day | ORAL | Status: DC
  Administered 2022-02-01: 25 mg via ORAL
  Filled 2022-01-31 (×2): qty 1

## 2022-01-31 MED ORDER — VITAMIN D (ERGOCALCIFEROL) 1.25 MG (50000 UNIT) PO CAPS
50000.0000 [IU] | ORAL_CAPSULE | ORAL | Status: DC
  Filled 2022-01-31: qty 1

## 2022-01-31 MED ORDER — FERROUS SULFATE 325 (65 FE) MG PO TABS
325.0000 mg | ORAL_TABLET | Freq: Two times a day (BID) | ORAL | Status: DC
  Administered 2022-01-31 – 2022-02-01 (×2): 325 mg via ORAL
  Filled 2022-01-31 (×2): qty 1
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
## 2022-01-31 NOTE — ED Triage Notes (Signed)
EMS stated, pt has SOB and has asthma and says she is out of her Albuterol. Did not give any medication due to all symptoms are normal. No wheezing, oxygen level was 97 %

## 2022-01-31 NOTE — ED Notes (Signed)
Received verbal report from Paden B RN at this time 
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
## 2022-01-31 NOTE — ED Notes (Signed)
Patient belongings removed and currently being inventoried by EMT. Patient provided photocopy of the back of her phone so she can keep a picture of her daughter. Patient remains in bed and resting at this time.
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
## 2022-02-01 DIAGNOSIS — F4321 Adjustment disorder with depressed mood: Secondary | ICD-10-CM

## 2022-02-01 NOTE — Discharge Instructions (Addendum)
For your behavioral health needs you are advised to follow up with [facility] at your earliest opportunity:      [facility]      [place], [place] [address]      [phone]      They offer psychiatry/medication management and therapy.  New patients are seen in their walk-in clinic.  Walk-in hours are Monday - Thursday from 8:00 am - 11:00 am for psychiatry, and Friday from 1:00 pm - 4:00 pm for therapy.  Walk-in patients are seen on a first come, first served basis, so try to arrive as early as possible for the best chance of being seen the same day.  Please note that to be eligible for services you must bring an ID or a piece of mail with your name and a [facility] address.

## 2022-02-01 NOTE — BH Assessment (Signed)
[facility] Assessment Progress Note   Per Merlyn Lot, NP, this pt does not require psychiatric hospitalization at this time.  Pt is psychiatrically cleared.  Discharge instructions include referral information for [facility].  Pt's nurse, Shindler, has been notified.  Jalene Mullet, [facility] Triage Specialist [phone]

## 2022-02-01 NOTE — ED Notes (Signed)
Pt requesting to go to the bathroom and states that she needs a bad and socks. Pt provided same at this time. And ambulated to restroom without assistance. Pharmacy sent request for meds
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
## 2022-02-01 NOTE — ED Notes (Signed)
Patient's wanted her belongings sent home with her friend Elberta Fortis.

## 2022-02-01 NOTE — Consult Note (Signed)
Telepsych Consultation   Reason for Consult:  Psychiatric Reassessment Referring Physician:  Hanley Hays, PA-C Location of Patient:    [name] Location of Provider:  Other: virtual home office  Patient Identification: Gina Burton MRN:  [id number] Principal Diagnosis: Adjustment disorder with depressed mood Diagnosis:  Principal Problem:   Adjustment disorder with depressed mood Active Problems:   Major depression, chronic   GAD (generalized anxiety disorder)   Total Time spent with patient: 30 minutes  Subjective:   Gina Burton is a 51 y.o. female patient admitted with suicidal ideation.  HPI:   Patient seen via telepsych by this provider; chart reviewed and consulted with Dr. Serafina Mitchell on 02/01/22.  On evaluation Gina Burton is sitting in the exam room on a chair. Smiles when greeted by this Probation officer.  Her primary language is Spanish, so interpretor used throughout the duration of assessment to facilitate communication. Patient reports, "I'm feeling good.  Thank God for that."  States she no longer wants to hurt herself, denies plan or intent for self harm.  She collaborates most of what was previously reported in [facility] initial interview.  Endorses personal stressors, recent loss of her daughter, housing concerns,  was upset when she came to the ED and states she made suicidal ideations out of anger and annoyance.  Reports she regrets saying, "I wish I was dead."  States she has no plans to plans to jump off a bridge.  She reminds this Probation officer that she voluntary presented to the ED for care, so knows how to reach out for help when needed.  Since resting and having a chance to evaluate her situation, states, although it is difficult to lose a child, she recognizes that her daughter would want her to go on and take care of herself, "and that's what I'm gonna do."  States she has family, cousin whom she spoke to today, she is supportive of her  and plans to come to the hospital to see her.  Pt endorses religious protective factors against self harm.    She does not want to return to her previous living situation with female friend, plans go stay with family.     She reports hx of depression, bipolar, anxiety and disorders.  Has a hx for suicide attempt several years ago.  Take quetiapine 25mg  po daily for sleep but currently not taking anything for bipolar concerns.  She does not appear manic today, although she is quite talkative.  No grandiosity, flight of ideas seen today.  She is medically cleared.   During evaluation Gina Burton is seated on chair. She is alert/oriented x 4; calm/cooperative; and mood congruent with affect.  Patient is speaking in a clear tone at moderate volume, and normal pace; with good eye contact.  Her thought process is coherent and relevant; There is no indication that she is currently responding to internal/external stimuli or experiencing delusional thought content.  Patient denies suicidal/self-harm/homicidal ideation, psychosis, and paranoia.  Patient has remained calm throughout assessment and has answered questions appropriately.    Past Psychiatric History: as outlined below  Risk to Self:  no Risk to Others:  no Prior Inpatient Therapy: no  Prior Outpatient Therapy:  no  Past Medical History:  Past Medical History:  Diagnosis Date   Asthma    Bipolar disorder, current episode hypomanic ([place])     Past Surgical History:  Procedure Laterality Date   arm surgery     CHOLECYSTECTOMY  1997   Family History:  Family History  Problem Relation Age of Onset   Cancer Mother        Possibly Esophageal   Asthma Mother    COPD Mother        smoker   Bipolar disorder Mother        Possibly as well.  Not clear if diagnosed   Seizures Sister    Family Psychiatric  History: unknown Social History:  Social History   Substance and Sexual Activity  Alcohol Use No     Social  History   Substance and Sexual Activity  Drug Use No    Social History   Socioeconomic History   Marital status: Soil scientist    Spouse name: Braulio Conte   Number of children: 4   Years of education: Not on file   Highest education level: Not on file  Occupational History   Not on file  Tobacco Use   Smoking status: Every Day    Packs/day: 0.50    Types: Cigarettes   Smokeless tobacco: Never  Substance and Sexual Activity   Alcohol use: No   Drug use: No   Sexual activity: Not Currently  Other Topics Concern   Not on file  Social History Narrative   Lives with a boyfriend on and off for past year.   Her boyfriend travels a lot and she believes he has other relationships with other women.   No others live in the same home, though she feels the neighbors do not like her.     History of trauma   Her middle son and a friend in particular caused significant trauma when she lived in [place]    Social Determinants of Health   Financial Resource Strain: Not on file  Food Insecurity: Not on file  Transportation Needs: Not on file  Physical Activity: Not on file  Stress: Not on file  Social Connections: Not on file   Additional Social History:    Allergies:  No Known Allergies  Labs:  Results for orders placed or performed during the hospital encounter of 01/31/22 (from the past 48 hour(s))  Comprehensive metabolic panel     Status: Abnormal   Collection Time: 01/31/22  8:45 AM  Result Value Ref Range   Sodium 137 135 - 145 mmol/L   Potassium 3.5 3.5 - 5.1 mmol/L   Chloride 105 98 - 111 mmol/L   CO2 21 (L) 22 - 32 mmol/L   Glucose, Bld 90 70 - 99 mg/dL    Comment: Glucose reference range applies only to samples taken after fasting for at least 8 hours.   BUN 13 6 - 20 mg/dL   Creatinine, Ser 0.65 0.44 - 1.00 mg/dL   Calcium 8.9 8.9 - 10.3 mg/dL   Total Protein 6.8 6.5 - 8.1 g/dL   Albumin 3.5 3.5 - 5.0 g/dL   AST 16 15 - 41 U/L   ALT 13 0 - 44 U/L    Alkaline Phosphatase 92 38 - 126 U/L   Total Bilirubin 0.4 0.3 - 1.2 mg/dL   GFR, Estimated >60 >60 mL/min    Comment: (NOTE) Calculated using the CKD-EPI Creatinine Equation (2021)    Anion gap 11 5 - 15    Comment: Performed at [facility] [address], [place], [place] [address]  CBC with Differential     Status: Abnormal   Collection Time: 01/31/22  8:45 AM  Result Value Ref Range   WBC 10.6 (  H) 4.0 - 10.5 K/uL   RBC 4.45 3.87 - 5.11 MIL/uL   Hemoglobin 9.1 (L) 12.0 - 15.0 g/dL   HCT 30.9 (L) 36.0 - 46.0 %   MCV 69.4 (L) 80.0 - 100.0 fL   MCH 20.4 (L) 26.0 - 34.0 pg   MCHC 29.4 (L) 30.0 - 36.0 g/dL   RDW 20.1 (H) 11.5 - 15.5 %   Platelets 287 150 - 400 K/uL    Comment: REPEATED TO VERIFY   nRBC 0.0 0.0 - 0.2 %   Neutrophils Relative % 78 %   Neutro Abs 8.4 (H) 1.7 - 7.7 K/uL   Lymphocytes Relative 10 %   Lymphs Abs 1.0 0.7 - 4.0 K/uL   Monocytes Relative 8 %   Monocytes Absolute 0.8 0.1 - 1.0 K/uL   Eosinophils Relative 3 %   Eosinophils Absolute 0.3 0.0 - 0.5 K/uL   Basophils Relative 1 %   Basophils Absolute 0.1 0.0 - 0.1 K/uL   Immature Granulocytes 0 %   Abs Immature Granulocytes 0.03 0.00 - 0.07 K/uL    Comment: Performed at [facility], [place] [address] [place], [place] [address]  Ethanol     Status: None   Collection Time: 01/31/22  9:35 AM  Result Value Ref Range   Alcohol, Ethyl (B) <10 <10 mg/dL    Comment: (NOTE) Lowest detectable limit for serum alcohol is 10 mg/dL.  For medical purposes only. Performed at [facility], [place] [address], [place], [place] [address]   Troponin I (High Sensitivity)     Status: None   Collection Time: 01/31/22 12:16 PM  Result Value Ref Range   Troponin I (High Sensitivity) 3 <18 ng/L    Comment: (NOTE) Elevated high sensitivity troponin I (hsTnI) values and significant  changes across serial measurements may suggest ACS but many other  chronic and acute conditions are known to elevate hsTnI  results.  Refer to the "Links" section for chest pain algorithms and additional  guidance. Performed at [facility], [place] [address], [place], [place] [address]   Urine rapid drug screen (hosp performed)     Status: None   Collection Time: 01/31/22  1:17 PM  Result Value Ref Range   Opiates NONE DETECTED NONE DETECTED   Cocaine NONE DETECTED NONE DETECTED   Benzodiazepines NONE DETECTED NONE DETECTED   Amphetamines NONE DETECTED NONE DETECTED   Tetrahydrocannabinol NONE DETECTED NONE DETECTED   Barbiturates NONE DETECTED NONE DETECTED    Comment: (NOTE) DRUG SCREEN FOR MEDICAL PURPOSES ONLY.  IF CONFIRMATION IS NEEDED FOR ANY PURPOSE, NOTIFY LAB WITHIN 5 DAYS.  LOWEST DETECTABLE LIMITS FOR URINE DRUG SCREEN Drug Class                     Cutoff (ng/mL) Amphetamine and metabolites    1000 Barbiturate and metabolites    200 Benzodiazepine                 245 Tricyclics and metabolites     300 Opiates and metabolites        300 Cocaine and metabolites        300 THC                            50 Performed at [facility], [place] [address], [place], [place] [address]   Urinalysis, Routine w reflex microscopic Urine, Clean Catch  Status: Abnormal   Collection Time: 01/31/22  1:17 PM  Result Value Ref Range   Color, Urine YELLOW YELLOW   APPearance CLEAR CLEAR   Specific Gravity, Urine 1.013 1.005 - 1.030   pH 5.0 5.0 - 8.0   Glucose, UA NEGATIVE NEGATIVE mg/dL   Hgb urine dipstick SMALL (A) NEGATIVE   Bilirubin Urine NEGATIVE NEGATIVE   Ketones, ur 20 (A) NEGATIVE mg/dL   Protein, ur NEGATIVE NEGATIVE mg/dL   Nitrite NEGATIVE NEGATIVE   Leukocytes,Ua TRACE (A) NEGATIVE   RBC / HPF 0-5 0 - 5 RBC/hpf   WBC, UA 6-10 0 - 5 WBC/hpf   Bacteria, UA RARE (A) NONE SEEN   Squamous Epithelial / LPF 0-5 0 - 5   Mucus PRESENT    Non Squamous Epithelial 0-5 (A) NONE SEEN    Comment: Performed at [facility], [address], [place], [place] [address]   Pregnancy, urine     Status: None   Collection Time: 01/31/22  1:17 PM  Result Value Ref Range   Preg Test, Ur NEGATIVE NEGATIVE    Comment:        THE SENSITIVITY OF THIS METHODOLOGY IS >20 mIU/mL. Performed at [facility], [place] [address], [place], [place] [address]   Troponin I (High Sensitivity)     Status: None   Collection Time: 01/31/22  1:37 PM  Result Value Ref Range   Troponin I (High Sensitivity) 3 <18 ng/L    Comment: (NOTE) Elevated high sensitivity troponin I (hsTnI) values and significant  changes across serial measurements may suggest ACS but many other  chronic and acute conditions are known to elevate hsTnI results.  Refer to the "Links" section for chest pain algorithms and additional  guidance. Performed at [facility], [place] [address], [place], [place] [address]   Resp Panel by RT-PCR (Flu A&B, Covid) Nasopharyngeal Swab     Status: None   Collection Time: 01/31/22  3:34 PM   Specimen: Nasopharyngeal Swab; Nasopharyngeal(NP) swabs in vial transport medium  Result Value Ref Range   SARS Coronavirus 2 by RT PCR NEGATIVE NEGATIVE    Comment: (NOTE) SARS-CoV-2 target nucleic acids are NOT DETECTED.  The SARS-CoV-2 RNA is generally detectable in upper respiratory specimens during the acute phase of infection. The lowest concentration of SARS-CoV-2 viral copies this assay can detect is 138 copies/mL. A negative result does not preclude SARS-Cov-2 infection and should not be used as the sole basis for treatment or other patient management decisions. A negative result may occur with  improper specimen collection/handling, submission of specimen other than nasopharyngeal swab, presence of viral mutation(s) within the areas targeted by this assay, and inadequate number of viral copies(<138 copies/mL). A negative result must be combined with clinical observations, patient history, and epidemiological information. The expected result is  Negative.  Fact Sheet for Patients:  EntrepreneurPulse.com.au  Fact Sheet for Healthcare Providers:  IncredibleEmployment.be  This test is no t yet approved or cleared by the [place] FDA and  has been authorized for detection and/or diagnosis of SARS-CoV-2 by FDA under an Emergency Use Authorization (EUA). This EUA will remain  in effect (meaning this test can be used) for the duration of the COVID-19 declaration under Section 564(b)(1) of the Act, 21 U.S.C.section 360bbb-3(b)(1), unless the authorization is terminated  or revoked sooner.       Influenza A by PCR NEGATIVE NEGATIVE   Influenza B by PCR NEGATIVE NEGATIVE    Comment: (  NOTE) The Xpert Xpress SARS-CoV-2/FLU/RSV plus assay is intended as an aid in the diagnosis of influenza from Nasopharyngeal swab specimens and should not be used as a sole basis for treatment. Nasal washings and aspirates are unacceptable for Xpert Xpress SARS-CoV-2/FLU/RSV testing.  Fact Sheet for Patients: EntrepreneurPulse.com.au  Fact Sheet for Healthcare Providers: IncredibleEmployment.be  This test is not yet approved or cleared by the [place] FDA and has been authorized for detection and/or diagnosis of SARS-CoV-2 by FDA under an Emergency Use Authorization (EUA). This EUA will remain in effect (meaning this test can be used) for the duration of the COVID-19 declaration under Section 564(b)(1) of the Act, 21 U.S.C. section 360bbb-3(b)(1), unless the authorization is terminated or revoked.  Performed at [facility], [place] [address] [place], [place] [address]   I-Stat beta hCG blood, ED     Status: None   Collection Time: 01/31/22  5:46 PM  Result Value Ref Range   I-stat hCG, quantitative <5.0 <5 mIU/mL   Comment 3            Comment:   GEST. AGE      CONC.  (mIU/mL)   <=1 WEEK        5 - 50     2 WEEKS       50 - 500     3 WEEKS       100 -  10,000     4 WEEKS     1,000 - 30,000        FEMALE AND NON-PREGNANT FEMALE:     LESS THAN 5 mIU/mL     Medications:  Current Facility-Administered Medications  Medication Dose Route Frequency Provider Last Rate Last Admin   albuterol (VENTOLIN HFA) 108 (90 Base) MCG/ACT inhaler 1 puff  1 puff Inhalation Once PRN Sherrell Puller, PA-C       ferrous sulfate tablet 325 mg  325 mg Oral BID WC Sherrell Puller, PA-C   325 mg at 02/01/22 0753   QUEtiapine (SEROQUEL) tablet 25 mg  25 mg Oral QHS Sherrell Puller, PA-C   25 mg at 02/01/22 5397   Vitamin D (Ergocalciferol) (DRISDOL) capsule 50,000 Units  50,000 Units Oral Q7 days Sherrell Puller, [place]       Current Outpatient Medications  Medication Sig Dispense Refill   albuterol (VENTOLIN HFA) 108 (90 Base) MCG/ACT inhaler Inhale 2 puffs into the lungs every 6 (six) hours as needed for wheezing or shortness of breath. (Patient not taking: Reported on 01/31/2022) 8.5 g 2   COVID-19 mRNA vaccine, Moderna, 100 MCG/0.5ML injection Inject into the muscle. (Patient not taking: Reported on 01/31/2022) 0.25 mL 0   ferrous sulfate 325 (65 FE) MG tablet TAKE 1 EACH BY MOUTH IN THE MORNING AND AT BEDTIME. (Patient not taking: Reported on 01/31/2022) 60 tablet 2   hydrOXYzine (VISTARIL) 25 MG capsule take 2 capsules by mouth twice a day as needed for anxiety (Patient not taking: Reported on 01/31/2022) 120 capsule 1   ibuprofen (ADVIL) 600 MG tablet Take 1 tablet (600 mg total) by mouth every 8 (eight) hours as needed. (Patient not taking: Reported on 01/31/2022) 30 tablet 1   Lidocaine 4 % PTCH APPLY TO LEFT CHEST WALL ONCE DAILY. APPLY AT 8AM AND REMOVE AT 8PM. (Patient not taking: Reported on 01/31/2022) 15 patch 0   QUEtiapine (SEROQUEL) 25 MG tablet TAKE 1 TABLET BY MOUTH AT BEDTIME (Patient not taking: Reported on 01/31/2022) 30 tablet 3   Vitamin D,  Ergocalciferol, (DRISDOL) 1.25 MG (50000 UNIT) CAPS capsule TAKE 1 CAPSULE (50,000 UNITS TOTAL) BY MOUTH EVERY 7 (SEVEN)  DAYS. (Patient not taking: Reported on 01/31/2022) 4 capsule 2    Musculoskeletal: Patient seen ambulating in exam room, no ambulatory concerns.  Strength & Muscle Tone: within normal limits Gait & Station: normal Patient leans: N/A   Psychiatric Specialty Exam:  Presentation  General Appearance: Appropriate for Environment; Casual  Eye Contact:Good  Speech:Clear and Coherent; Normal Rate  Speech Volume:Normal  Handedness:Right   Mood and Affect  Mood:Euthymic  Affect:Appropriate; Congruent   Thought Process  Thought Processes:Coherent; Goal Directed  Descriptions of Associations:Circumstantial  Orientation:Full (Time, Place and Person)  Thought Content:Logical  History of Schizophrenia/Schizoaffective disorder:No  Duration of Psychotic Symptoms:No data recorded Hallucinations:Hallucinations: None  Ideas of Reference:None  Suicidal Thoughts:Suicidal Thoughts: No (has improved since admission and time to rest and think about things)  Homicidal Thoughts:Homicidal Thoughts: No   Sensorium  Memory:Immediate Good; Recent Good; Remote Good  Judgment:Good  Insight:Fair   Executive Functions  Concentration:Good  Attention Span:Good  [place] of Knowledge:Good  Language:Good   Psychomotor Activity  Psychomotor Activity:Psychomotor Activity: Normal   Assets  Assets:Communication Skills; Social Support; Desire for Improvement   Sleep  Sleep:Sleep: Good Number of Hours of Sleep: 6    Physical Exam: Physical Exam Constitutional:      Appearance: She is well-developed.  Cardiovascular:     Rate and Rhythm: Normal rate.     Pulses: Normal pulses.  Pulmonary:     Effort: Pulmonary effort is normal.  Musculoskeletal:        General: Normal range of motion.     Cervical back: Normal range of motion.  Neurological:     General: No focal deficit present.     Mental Status: She is alert and oriented to person, place, and time.   Psychiatric:        Mood and Affect: Mood normal.        Behavior: Behavior normal.   Review of Systems  Constitutional: Negative.   HENT: Negative.    Eyes: Negative.   Respiratory: Negative.    Cardiovascular: Negative.   Gastrointestinal: Negative.   Genitourinary: Negative.   Musculoskeletal: Negative.   Skin: Negative.   Neurological: Negative.   Endo/Heme/Allergies: Negative.   Psychiatric/Behavioral:  Positive for depression. Negative for hallucinations, substance abuse and suicidal ideas (has improved since admission). The patient is not nervous/anxious.   Blood pressure 135/81, pulse 80, temperature 98.3 F (36.8 C), temperature source Oral, resp. rate 18, height 5\' 4"  (1.626 m), weight 65.8 kg, last menstrual period 01/31/2022, SpO2 100 %. Body mass index is 24.89 kg/m.  Treatment Plan Summary: Patient no longer endorses suicidal ideations, plan or intent.  Contracts for safety and endorses familial and religious protective factors.   Plan- As per above assessment, there are no current grounds for involuntary commitment at this time. Recommend she continue home medications and will refer to outpatient psychiatry for medication mgmt and therapy.    Patient is not currently interested in inpatient services but expresses agreement to continue outpatient treatment.  SW will add these resources to discharge AVS.  Above discussed with patient concordance.    Disposition: No evidence of imminent risk to self or others at present.   Patient does not meet criteria for psychiatric inpatient admission. Supportive therapy provided about ongoing stressors. Discussed crisis plan, support from social network, calling 911, coming to the Emergency Department, and calling Suicide Hotline.  This service  was provided via telemedicine using a 2-way, interactive audio and video technology.  Names of all persons participating in this telemedicine service and their role in this  encounter. Name: Gina Burton Role: Patient  Name: Merlyn Lot Role: PMHNP    Mallie Darting, NP 02/01/2022 4:39 PM

## 2022-02-01 NOTE — ED Notes (Signed)
C/o pain in bilat legs and c/o being SOB at this time

## 2022-02-01 NOTE — Consult Note (Signed)
Reached out to nurse for assistance completing psychiatric reassessment via TTS cart.  Awaiting response.

## 2022-02-01 NOTE — ED Notes (Signed)
Interpreter used for assessment

## 2022-02-01 NOTE — Consult Note (Signed)
Per nurse, cart in room and patient ready for TTS assessment.  Cart called many time but no answer.

## 2022-02-01 NOTE — ED Notes (Signed)
Returned from yellow from having her TTS done

## 2022-02-02 LAB — URINE CULTURE: Culture: 10000 — AB

## 2022-02-03 ENCOUNTER — Telehealth (HOSPITAL_BASED_OUTPATIENT_CLINIC_OR_DEPARTMENT_OTHER): Payer: Self-pay | Admitting: *Deleted

## 2022-02-03 NOTE — Telephone Encounter (Signed)
Post ED Visit - Positive Culture Follow-up  Culture report reviewed by antimicrobial stewardship pharmacist: [place] Team []  Elenor Quinones, Pharm.D. []  Heide Guile, Pharm.D., BCPS AQ-ID []  Parks Neptune, Pharm.D., BCPS []  Alycia Rossetti, Pharm.D., BCPS []  [place], Pharm.D., BCPS, AAHIVP []  Legrand Como, Pharm.D., BCPS, AAHIVP []  Salome Arnt, PharmD, BCPS []  Johnnette Gourd, PharmD, BCPS []  Hughes Better, PharmD, BCPS []  Leeroy Cha, PharmD []  Laqueta Linden, PharmD, BCPS [x]  Albertina Parr, PharmD  [place] Team []  Leodis Sias, PharmD []  Lindell Spar, PharmD []  Royetta Asal, PharmD []  Graylin Shiver, Rph []  Rema Fendt) Glennon Mac, PharmD []  Arlyn Dunning, PharmD []  Netta Cedars, PharmD []  Dia Sitter, PharmD []  Leone Haven, PharmD []  Gretta Arab, PharmD []  Theodis Shove, PharmD []  Peggyann Juba, PharmD []  Reuel Boom, PharmD   Positive urine culture no further patient follow-up is required at this time.  Rosie Fate 02/03/2022, 1:21 PM

## 2022-02-08 ENCOUNTER — Other Ambulatory Visit: Payer: Self-pay

## 2022-02-08 ENCOUNTER — Encounter (HOSPITAL_COMMUNITY): Payer: Self-pay | Admitting: Physician Assistant

## 2022-02-08 ENCOUNTER — Ambulatory Visit (INDEPENDENT_AMBULATORY_CARE_PROVIDER_SITE_OTHER): Payer: No Payment, Other | Admitting: Physician Assistant

## 2022-02-08 VITALS — BP 116/78 | HR 69 | Ht 64.0 in | Wt 145.0 lb

## 2022-02-08 DIAGNOSIS — F5104 Psychophysiologic insomnia: Secondary | ICD-10-CM

## 2022-02-08 DIAGNOSIS — F310 Bipolar disorder, current episode hypomanic: Secondary | ICD-10-CM | POA: Diagnosis not present

## 2022-02-08 DIAGNOSIS — F411 Generalized anxiety disorder: Secondary | ICD-10-CM

## 2022-02-08 MED ORDER — QUETIAPINE FUMARATE 50 MG PO TABS
50.0000 mg | ORAL_TABLET | Freq: Every day | ORAL | 2 refills | Status: DC
  Filled 2022-02-08: qty 30, 30d supply, fill #0

## 2022-02-08 MED ORDER — HYDROXYZINE HCL 10 MG PO TABS
10.0000 mg | ORAL_TABLET | Freq: Three times a day (TID) | ORAL | 1 refills | Status: DC | PRN
  Filled 2022-02-08: qty 75, 25d supply, fill #0

## 2022-02-08 MED ORDER — BUSPIRONE HCL 7.5 MG PO TABS
7.5000 mg | ORAL_TABLET | Freq: Two times a day (BID) | ORAL | 1 refills | Status: DC
  Filled 2022-02-08: qty 60, 30d supply, fill #0
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
## 2022-02-19 NOTE — Telephone Encounter (Signed)
Spoke with pt via interpreter [phone]) . Pt has been established with therapy and psychiatry. I reminded pt of upcoming therapy appt.

## 2022-02-22 ENCOUNTER — Ambulatory Visit (HOSPITAL_COMMUNITY): Payer: No Payment, Other | Admitting: Licensed Clinical Social Worker

## 2022-03-16 ENCOUNTER — Other Ambulatory Visit: Payer: Self-pay

## 2022-03-16 ENCOUNTER — Telehealth (HOSPITAL_COMMUNITY): Payer: Self-pay | Admitting: Physician Assistant

## 2022-03-16 ENCOUNTER — Ambulatory Visit (INDEPENDENT_AMBULATORY_CARE_PROVIDER_SITE_OTHER): Payer: No Payment, Other | Admitting: Physician Assistant

## 2022-03-16 DIAGNOSIS — F411 Generalized anxiety disorder: Secondary | ICD-10-CM | POA: Diagnosis not present

## 2022-03-16 DIAGNOSIS — F310 Bipolar disorder, current episode hypomanic: Secondary | ICD-10-CM

## 2022-03-16 DIAGNOSIS — F3132 Bipolar disorder, current episode depressed, moderate: Secondary | ICD-10-CM

## 2022-03-16 MED ORDER — HYDROXYZINE HCL 25 MG PO TABS
25.0000 mg | ORAL_TABLET | Freq: Three times a day (TID) | ORAL | 1 refills | Status: AC | PRN
  Filled 2022-03-16: qty 75, 25d supply, fill #0

## 2022-03-16 MED ORDER — BUSPIRONE HCL 10 MG PO TABS
10.0000 mg | ORAL_TABLET | Freq: Two times a day (BID) | ORAL | 1 refills | Status: DC
  Filled 2022-03-16: qty 60, 30d supply, fill #0

## 2022-03-16 MED ORDER — QUETIAPINE FUMARATE 150 MG PO TABS
150.0000 mg | ORAL_TABLET | Freq: Every day | ORAL | 1 refills | Status: DC
Start: 2022-03-16 — End: 2022-05-02
  Filled 2022-03-16: qty 30, 30d supply, fill #0

## 2022-03-16 NOTE — Telephone Encounter (Signed)
Patient requesting letter stating stating due to her condition she cannot work right now.  Pt wants this letter so she can get help from [facility] [address], [place].  Organization's phone [phone]
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
## 2022-03-19 ENCOUNTER — Encounter (HOSPITAL_COMMUNITY): Payer: Self-pay | Admitting: Physician Assistant

## 2022-04-26 ENCOUNTER — Ambulatory Visit (INDEPENDENT_AMBULATORY_CARE_PROVIDER_SITE_OTHER): Payer: No Payment, Other | Admitting: Clinical

## 2022-04-26 DIAGNOSIS — F3132 Bipolar disorder, current episode depressed, moderate: Secondary | ICD-10-CM | POA: Diagnosis not present
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
## 2022-04-27 ENCOUNTER — Encounter (HOSPITAL_COMMUNITY): Payer: Self-pay

## 2022-04-27 NOTE — Plan of Care (Signed)
?  Problem: Depression CCP Problem  1  ?Goal: LTG: Ladesha WILL SCORE LESS THAN 10 ON THE PATIENT HEALTH QUESTIONNAIRE (PHQ-9) ?Outcome: Not Applicable ?Goal: STG: Anela WILL IDENTIFY 3 COGNITIVE PATTERNS AND BELIEFS THAT SUPPORT DEPRESSION ?Outcome: Not Applicable ?  ?

## 2022-05-02 ENCOUNTER — Other Ambulatory Visit: Payer: Self-pay

## 2022-05-02 ENCOUNTER — Ambulatory Visit (INDEPENDENT_AMBULATORY_CARE_PROVIDER_SITE_OTHER): Payer: No Payment, Other | Admitting: Physician Assistant

## 2022-05-02 ENCOUNTER — Encounter (HOSPITAL_COMMUNITY): Payer: Self-pay | Admitting: Physician Assistant

## 2022-05-02 DIAGNOSIS — F411 Generalized anxiety disorder: Secondary | ICD-10-CM

## 2022-05-02 DIAGNOSIS — F3132 Bipolar disorder, current episode depressed, moderate: Secondary | ICD-10-CM

## 2022-05-02 MED ORDER — QUETIAPINE FUMARATE 200 MG PO TABS
200.0000 mg | ORAL_TABLET | Freq: Every day | ORAL | 1 refills | Status: DC
Start: 2022-05-02 — End: 2022-07-05
  Filled 2022-05-02 – 2022-05-16 (×2): qty 30, 30d supply, fill #0

## 2022-05-02 MED ORDER — BUSPIRONE HCL 15 MG PO TABS
15.0000 mg | ORAL_TABLET | Freq: Two times a day (BID) | ORAL | 1 refills | Status: DC
  Filled 2022-05-02 – 2022-05-16 (×2): qty 60, 30d supply, fill #0
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
## 2022-05-04 ENCOUNTER — Encounter (HOSPITAL_COMMUNITY): Payer: Self-pay | Admitting: Physician Assistant

## 2022-05-09 ENCOUNTER — Other Ambulatory Visit: Payer: Self-pay

## 2022-05-16 ENCOUNTER — Other Ambulatory Visit: Payer: Self-pay

## 2022-06-26 ENCOUNTER — Ambulatory Visit: Payer: Self-pay | Attending: Family Medicine | Admitting: Family Medicine

## 2022-06-26 ENCOUNTER — Encounter: Payer: Self-pay | Admitting: Family Medicine

## 2022-06-26 ENCOUNTER — Other Ambulatory Visit: Payer: Self-pay

## 2022-06-26 VITALS — BP 137/82 | HR 90 | Temp 98.1°F | Ht 64.0 in | Wt 151.0 lb

## 2022-06-26 DIAGNOSIS — D219 Benign neoplasm of connective and other soft tissue, unspecified: Secondary | ICD-10-CM

## 2022-06-26 DIAGNOSIS — D500 Iron deficiency anemia secondary to blood loss (chronic): Secondary | ICD-10-CM

## 2022-06-26 MED ORDER — FERROUS SULFATE 325 (65 FE) MG PO TABS
ORAL_TABLET | ORAL | 2 refills | Status: AC
  Filled 2022-06-26: qty 60, fill #0
  Filled 2022-07-05 – 2022-07-19 (×2): qty 60, 30d supply, fill #0

## 2022-06-26 NOTE — Patient Instructions (Signed)
Fibromas uterinos Uterine Fibroids  Los fibromas uterinos, tambin llamados liomiomas, son tumores no cancerosos (benignos) que pueden desarrollarse en el tero. Pueden causar sangrado menstrual abundante y dolor. Los fibromas tambin pueden desarrollarse en las trompas de [place], el cuello uterino o los tejidos (ligamentos) cerca del tero. Puede tener uno o muchos fibromas. Los fibromas tienen diferentes tamaos y pesos, y crecen en distintas partes del tero. Algunos pueden crecer hasta volverse bastante grandes. La mayora de los fibromas no requiere tratamiento mdico. Cules son las causas? Se desconoce la causa de esta afeccin. Qu incrementa el riesgo? Es ms probable que tenga esta afeccin si: Tiene entre 30 y 40 aos de edad y no ha atravesado la menopausia. Tienen antecedentes familiares de esta afeccin. Tienen ascendencia afroamericana. Inici su perodo menstrual a los 10 aos de edad o menos. No ha tenido hijos. Tiene sobrepeso u obesidad. Cules son los signos o sntomas? Muchas mujeres no tienen sntomas. Los sntomas de esta afeccin pueden incluir los siguientes: Hemorragias menstruales abundantes. Sangrado entre los perodos menstruales. Dolor y presin en la zona plvica, entre los huesos de la cadera. Dolor al tener sexo. Problemas en la vejiga, como necesidad de orinar de inmediato u orinar con ms frecuencia que lo habitual. Incapacidad para tener hijos (infertilidad). Imposibilidad de llevar un embarazo a trmino (aborto espontneo). Cmo se diagnostica? Esta afeccin se puede diagnosticar en funcin de lo siguiente: Los sntomas y los antecedentes mdicos. Un examen fsico. Un examen plvico que incluye la palpacin para detectar tumores. Estudios de diagnstico por imgenes, como una ecografa o una resonancia magntica (RM). Cmo se trata? El tratamiento para esta afeccin puede incluir visitas de seguimiento con su mdico para controlar si hubo  cambios en los fibromas. Otro tipo tratamiento puede incluir: Medicamentos, como por ejemplo: Medicamentos para aliviar el dolor, entre ellos aspirina y antiinflamatorios no esteroideos (AINE), como ibuprofeno o naproxeno. Terapia hormonal. El tratamiento puede administrarse en forma de pldora o de inyeccin, o puede insertarse en el tero mediante un dispositivo intrauterino (DIU). Una ciruga que hara una de estas cosas: Extirpar los fibromas (miomectoma). Puede recomendarse esta opcin si los fibromas afectan su fertilidad y tiene deseos de quedar embarazada. Extirpar el tero (histerectoma). Bloquear la irrigacin sangunea a los fibromas (embolizacin de la arteria uterina). Esto puede hacer que se encojan y mueran. Siga estas instrucciones en su casa: Medicamentos Tome los medicamentos de venta libre y los recetados solamente como se lo haya indicado el mdico. Consulte a su mdico si debe tomar comprimidos de hierro o comer ms alimentos ricos en hierro, como verduras de hojas de color verde oscuro. El sangrado menstrual excesivo pueden causar niveles bajos de hierro. Control del dolor Si se lo indican, aplique calor en la espalda o el abdomen para aliviar el dolor. Use la fuente de calor que el mdico le recomiende, como una compresa de calor hmedo o una almohadilla trmica. Para aplicar calor: Coloque una toalla entre la piel y la fuente de calor. Aplique calor durante 20 a 30 minutos. Retire la fuente de calor si la piel se pone de color rojo brillante. Esto es especialmente importante si no puede sentir dolor, calor o fro. Puede correr un mayor riesgo de sufrir quemaduras.  Instrucciones generales Preste mucha atencin a su ciclo menstrual. Informe al mdico si nota algn cambio, por ejemplo: Hemorragia ms intensa que requiere que cambie las compresas o los tampones ms de lo habitual. Un cambio en el nmero de das que le dura el perodo   menstrual. Un cambio en los sntomas  asociados con el perodo menstrual, como dolor de espalda o clicos abdominales. Cumpla con todas las visitas de seguimiento. Esto es importante, especialmente si los fibromas deben controlarse para detectar cualquier cambio. Comunquese con un mdico si: Tiene dolor plvico, dolor de espalda o clicos abdominales que no se alivian con los medicamentos o al aplicar calor. Presenta un nuevo sangrado entre los perodos menstruales. Observa un aumento del sangrado durante o entre los perodos menstruales. Se siente ms dbil o cansado que de costumbre. Se siente mareado. Solicite ayuda de inmediato si: Se desmaya. Tiene un dolor plvico que empeora sbitamente. Tiene sangrado vaginal intenso que empapa un tampn o un apsito en 30 minutos o menos. Resumen Los fibromas uterinos son tumores no cancerosos (benignos) que pueden desarrollarse en el tero. Se desconoce la causa exacta de esta afeccin. La mayora de los fibromas no necesitan tratamiento mdico, a menos que afecten su capacidad para tener hijos (fertilidad). Comunquese con el mdico si tiene dolor plvico, dolor de espalda o clicos abdominales que no se alivian con medicamentos. Obtenga ayuda de inmediato si se desmaya, tiene dolor plvico que empeora repentinamente o tiene una hemorragia vaginal intensa. Esta informacin no tiene como fin reemplazar el consejo del mdico. Asegrese de hacerle al mdico cualquier pregunta que tenga. Document Revised: 08/23/2020 Document Reviewed: 08/23/2020 Elsevier Patient Education  2023 Elsevier Inc.  

## 2022-06-26 NOTE — Progress Notes (Signed)
Abdominal swelling and vaginal bleeding. Has cycle 2 times a month
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
## 2022-06-27 LAB — CBC WITH DIFFERENTIAL/PLATELET
Basophils Absolute: 0.1 10*3/uL (ref 0.0–0.2)
Basos: 1 %
EOS (ABSOLUTE): 0.2 10*3/uL (ref 0.0–0.4)
Eos: 2 %
Hematocrit: 30.5 % — ABNORMAL LOW (ref 34.0–46.6)
Hemoglobin: 8.7 g/dL — ABNORMAL LOW (ref 11.1–15.9)
Immature Grans (Abs): 0 10*3/uL (ref 0.0–0.1)
Immature Granulocytes: 0 %
Lymphocytes Absolute: 2 10*3/uL (ref 0.7–3.1)
Lymphs: 25 %
MCH: 20 pg — ABNORMAL LOW (ref 26.6–33.0)
MCHC: 28.5 g/dL — ABNORMAL LOW (ref 31.5–35.7)
MCV: 70 fL — ABNORMAL LOW (ref 79–97)
Monocytes Absolute: 0.6 10*3/uL (ref 0.1–0.9)
Monocytes: 8 %
Neutrophils Absolute: 5.1 10*3/uL (ref 1.4–7.0)
Neutrophils: 64 %
Platelets: 422 10*3/uL (ref 150–450)
RBC: 4.34 x10E6/uL (ref 3.77–5.28)
RDW: 16.7 % — ABNORMAL HIGH (ref 11.7–15.4)
WBC: 8 10*3/uL (ref 3.4–10.8)

## 2022-07-04 ENCOUNTER — Encounter (HOSPITAL_COMMUNITY): Payer: No Payment, Other | Admitting: Physician Assistant

## 2022-07-05 ENCOUNTER — Other Ambulatory Visit: Payer: Self-pay

## 2022-07-05 ENCOUNTER — Encounter (HOSPITAL_COMMUNITY): Payer: Self-pay | Admitting: Student in an Organized Health Care Education/Training Program

## 2022-07-05 ENCOUNTER — Ambulatory Visit (INDEPENDENT_AMBULATORY_CARE_PROVIDER_SITE_OTHER): Payer: No Payment, Other | Admitting: Clinical

## 2022-07-05 ENCOUNTER — Ambulatory Visit (INDEPENDENT_AMBULATORY_CARE_PROVIDER_SITE_OTHER): Payer: No Payment, Other | Admitting: Student in an Organized Health Care Education/Training Program

## 2022-07-05 VITALS — BP 113/90 | HR 69 | Wt 144.0 lb

## 2022-07-05 DIAGNOSIS — F5104 Psychophysiologic insomnia: Secondary | ICD-10-CM | POA: Diagnosis not present

## 2022-07-05 DIAGNOSIS — T50905A Adverse effect of unspecified drugs, medicaments and biological substances, initial encounter: Secondary | ICD-10-CM

## 2022-07-05 DIAGNOSIS — F310 Bipolar disorder, current episode hypomanic: Secondary | ICD-10-CM

## 2022-07-05 DIAGNOSIS — F4321 Adjustment disorder with depressed mood: Secondary | ICD-10-CM

## 2022-07-05 DIAGNOSIS — G2571 Drug induced akathisia: Secondary | ICD-10-CM | POA: Diagnosis not present

## 2022-07-05 DIAGNOSIS — F411 Generalized anxiety disorder: Secondary | ICD-10-CM

## 2022-07-05 MED ORDER — LITHIUM CARBONATE 300 MG PO TABS
300.0000 mg | ORAL_TABLET | Freq: Two times a day (BID) | ORAL | 2 refills | Status: DC
  Filled 2022-07-05: qty 60, 30d supply, fill #0

## 2022-07-05 MED ORDER — BENZTROPINE MESYLATE 1 MG PO TABS
1.0000 mg | ORAL_TABLET | Freq: Every day | ORAL | 2 refills | Status: DC
  Filled 2022-07-05: qty 30, 30d supply, fill #0

## 2022-07-05 MED ORDER — OLANZAPINE 10 MG PO TABS
10.0000 mg | ORAL_TABLET | Freq: Every day | ORAL | 2 refills | Status: DC
  Filled 2022-07-05: qty 30, 30d supply, fill #0
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
## 2022-07-05 NOTE — Patient Instructions (Signed)
Please get lab work done in 1 week. 07/12/2021. Please do not take your Lithium that morning until after getting your lab work.   [facility]: [address], [place], [place] [address]

## 2022-07-05 NOTE — Progress Notes (Signed)
BH MD/PA/NP OP Progress Note  07/05/2022 11:09 AM Gina Burton  MRN:  [id number]  Chief Complaint: No chief complaint on file.  HPI: Gina Burton is a 51 year old female with a past psychiatric history significant for generalized anxiety disorder and bipolar disorder who presents to [facility] for follow-up and medication management.  Spanish interpretive services were utilized during the duration of the encounter.  Patient is currently being managed on the following medications:  Seroquel 200 mg nightly BuSpar 15 mg twice daily  On assessment today patient reports that she is extremely restless.  Patient reports that although she finds it difficult to present at [place] offices in hospitals that she normally feels more comfortable at this office.  Objectively, patient is noted to be very restless and required multiple attempts to get her blood pressure due to patient speaking with her hands and moving frequently in her chair.  Patient makes multiple comments about how restless she is.  Patient reports that her husband has also endorsed some annoyance with how restless she is.  Patient reports that she is feeling a little bit more irritable as well.  Patient reports that her thoughts are a bit rapid and that her husband has remarked that she is speaking so much that he cannot get a word in.  Patient reports that despite this she has been able to sleep and endorses that this is because she takes the Seroquel.  Patient reports that there are some occasions so she will get up at night however she does feel that the Seroquel really helps her sleep.  Patient reports that Seroquel is sedating however she has noticed that she has troubles with her legs when she is on the Seroquel.  Patient reports she was able to confirm this by skipping a few days noticing that her problems went away.  Patient endorses that her legs will feel a bit more weak.   Patient reports that she will feel like someone takes any to the back of her leg trying to push her down.  On further review of patient's restlessness, provider directly asked patient if she feels like she wants to crawl out of her skin and patient exclaims that this is the exact feelings she is having.  Due to patient endorsing significant anxiety in her daily life and GAD-7 was done, patient had a score of 21 out of 21.  Patient did the Spanish version of the GAD-7.  Patient reports that her mind continues to think about her daughter and she is primarily worried about her.  Patient reports that she has also noticed in the last week she has began having visual hallucinations.  Patient reports that she is seeing people crossing the road that are not really there.  Patient reports that because of this she has stopped driving, because she will suddenly hit her brakes and she does not want to cause an accident.  Patient denies auditory hallucinations.  Patient also denies SI and HI.  Patient reports that she recalls having similar symptoms of visual hallucinations when she was hospitalized in [place].  Patient reports that she was placed on Klonopin, Xanax and Ambien all at the same time during this hospitalization because she is not sleeping.  Patient reports that she did not think those medications helped her and recalls not feeling tired despite being on these medications.  Patient does recall being started on lithium and feeling that it helped her more.  Patient reports  that she recalls being told she would need lithium for the rest of her life; however she started to feel better and stopped seeing medication management provider.  Patient reports that she did not have any adverse side effects with lithium.  Objectively, patient presented almost an hour early for her appointment and did not have any behavior problems while sitting in the waiting room. Visit Diagnosis:    ICD-10-CM   1. Bipolar  disorder, current episode hypomanic (HCC)  F31.0 OLANZapine (ZYPREXA) 10 MG tablet    lithium 300 MG tablet    Lithium level    2. GAD (generalized anxiety disorder)  F41.1 OLANZapine (ZYPREXA) 10 MG tablet    3. Psychophysiological insomnia  F51.04     4. Acute medication-induced akathisia  G25.71 benztropine (COGENTIN) 1 MG tablet   T50.905A OLANZapine (ZYPREXA) 10 MG tablet      Past Psychiatric History: Generalized anxiety disorder Bipolar disorder  Past Medical History:  Past Medical History:  Diagnosis Date   Asthma    Bipolar disorder, current episode hypomanic ([place])     Past Surgical History:  Procedure Laterality Date   arm surgery     CHOLECYSTECTOMY  1997    Family Psychiatric History: Patient is unsure of family history of psychiatric illness due to not being raised with her family    Family History:  Family History  Problem Relation Age of Onset   Cancer Mother        Possibly Esophageal   Asthma Mother    COPD Mother        smoker   Bipolar disorder Mother        Possibly as well.  Not clear if diagnosed   Seizures Sister     Social History:  Social History   Socioeconomic History   Marital status: Soil scientist    Spouse name: Braulio Conte   Number of children: 4   Years of education: Not on file   Highest education level: Not on file  Occupational History   Not on file  Tobacco Use   Smoking status: Every Day    Packs/day: 0.50    Types: Cigarettes   Smokeless tobacco: Never  Substance and Sexual Activity   Alcohol use: No   Drug use: No   Sexual activity: Not Currently  Other Topics Concern   Not on file  Social History Narrative   Lives with a boyfriend on and off for past year.   Her boyfriend travels a lot and she believes he has other relationships with other women.   No others live in the same home, though she feels the neighbors do not like her.     History of trauma   Her middle son and a friend in particular caused  significant trauma when she lived in [place]    Social Determinants of Health   Financial Resource Strain: Not on file  Food Insecurity: Not on file  Transportation Needs: Not on file  Physical Activity: Not on file  Stress: Not on file  Social Connections: Not on file    Allergies: No Known Allergies  Metabolic Disorder Labs: No results found for: "HGBA1C", "MPG" No results found for: "PROLACTIN" Lab Results  Component Value Date   CHOL 158 09/08/2020   TRIG 52 09/08/2020   HDL 66 09/08/2020   CHOLHDL 2.4 09/08/2020   LDLCALC 81 09/08/2020   Lab Results  Component Value Date   TSH 2.060 09/08/2020   TSH 2.000 10/07/2018  Therapeutic Level Labs: Lab Results  Component Value Date   LITHIUM <0.06 (L) 11/03/2016   No results found for: "VALPROATE" No results found for: "CBMZ"  Current Medications: Current Outpatient Medications  Medication Sig Dispense Refill   benztropine (COGENTIN) 1 MG tablet Take 1 tablet (1 mg total) by mouth daily. 60 tablet 2   lithium 300 MG tablet Take 1 tablet (300 mg total) by mouth 2 (two) times daily. 60 tablet 2   OLANZapine (ZYPREXA) 10 MG tablet Take 1 tablet (10 mg total) by mouth at bedtime. 30 tablet 2   albuterol (VENTOLIN HFA) 108 (90 Base) MCG/ACT inhaler Inhale 2 puffs into the lungs every 6 (six) hours as needed for wheezing or shortness of breath. (Patient not taking: Reported on 01/31/2022) 8.5 g 2   COVID-19 mRNA vaccine, Moderna, 100 MCG/0.5ML injection Inject into the muscle. (Patient not taking: Reported on 01/31/2022) 0.25 mL 0   ferrous sulfate 325 (65 FE) MG tablet TAKE 1 EACH BY MOUTH IN THE MORNING AND AT BEDTIME. 60 tablet 2   hydrOXYzine (ATARAX) 25 MG tablet Take 1 tablet (25 mg total) by mouth 3 (three) times daily as needed. 75 tablet 1   ibuprofen (ADVIL) 600 MG tablet Take 1 tablet (600 mg total) by mouth every 8 (eight) hours as needed. (Patient not taking: Reported on 01/31/2022) 30 tablet 1   No current  facility-administered medications for this visit.     Musculoskeletal: Strength & Muscle Tone: within normal limits Gait & Station: normal Patient leans: N/A  Psychiatric Specialty Exam: Review of Systems  Psychiatric/Behavioral:  Positive for hallucinations and sleep disturbance. Negative for dysphoric mood and suicidal ideas. The patient is hyperactive.     There were no vitals taken for this visit.There is no height or weight on file to calculate BMI.  General Appearance: Casual  Eye Contact:  Fair  Speech:  Clear and Coherent and Pressured  Volume:  Normal  Mood:  Anxious  Affect:  Congruent  Thought Process:  Goal Directed  Orientation:  Full (Time, Place, and Person)  Thought Content: Logical   Suicidal Thoughts:  No  Homicidal Thoughts:  No  Memory:  Immediate;   Good Recent;   Good Remote;   Good  Judgement:  Fair  Insight:  Good  Psychomotor Activity:  Restlessness  Concentration:  Concentration: Fair  Recall:  NA  Fund of Knowledge: Fair  Language: Good  Akathisia:  Yes  Handed:  Right  AIMS (if indicated): not done  Assets:  Communication Skills Desire for Improvement Housing Intimacy Resilience  ADL's:  Intact  Cognition: WNL  Sleep:  Fair   Screenings: GAD-7    Personnel officer Visit from 06/26/2022 in [place] Office Visit from 05/02/2022 in [facility] Office Visit from 03/16/2022 in [facility] Office Visit from 02/08/2022 in [facility] Office Visit from 10/26/2021 in [place]  Total GAD-7 Score '17 11 21 14 21      '$ PHQ2-9    [place] Office Visit from 06/26/2022 in [place] Office Visit from 05/02/2022 in [facility] Counselor from 04/26/2022 in [facility] Office Visit from 03/16/2022 in [facility] Office Visit from 02/08/2022 in [facility]  PHQ-2 Total Score '5 3 6 6 6  '$ PHQ-9 Total Score '25 21 25 '$ 26  [place] Office Visit from 05/02/2022 in [facility] Counselor from 04/26/2022 in [facility] Office Visit from 03/16/2022 in [place] CATEGORY Moderate Risk Moderate Risk Moderate Risk        Assessment and Plan:   Patient presents in what appears to be hypomanic episode.  However she is endorsing psychosis but is able to reality test.  Safety planning was done with patient and patient endorsed that she was aware that she may have to consider hospitalization.  Patient was educated on the [facility] and endorsed that if she did not see some improvement in her symptoms that she should present for emergent evaluation for possible inpatient psychiatric hospitalization.  At this time patient is endorsing racing thoughts, rapid speech, irritability and akathisia.  Patient was taking Seroquel the last few days and endorses sleeping well on the medication.  Patient endorsed feeling that she had a good idea of when she would need to present herself for inpatient hospitalization, and felt that she was still at a place where she could return home, and was godliness based off of her previous hospitalization.  Due to severe akathisia and other reported side effects patient Seroquel be discontinued.  Patient will be started on Zyprexa of a near equivalent dose of 10 mg with Cogentin 1 mg a day.  Patient will also be started on lithium 300 mg twice daily.  Patient was educated on the importance of being compliant with her lithium and labs.  Patient endorse that she felt that she could be compliant getting lithium levels.  Patient received brief education about the importance and reasoning behind lab work for lithium.  Patient's interpreter assisted  in writing instructions in Espanol regarding how patient is to take her lithium the morning that she gets her lab work done.  Bipolar disorder, current episode hypomanic with psychotic features GAD Medication induced akathisia - Discontinue Seroquel 200 mg nightly, for akathisia - Discontinue BuSpar 15 mg twice daily - Start Zyprexa 10 mg nightly - Start Cogentin 1 mg nightly - Start lithium 300 mg twice daily - Lithium level 07/12/2022 -Patient to follow-up in approximately 2 weeks  Collaboration of Care: Collaboration of Care:   Patient/Guardian was advised Release of Information must be obtained prior to any record release in order to collaborate their care with an outside provider. Patient/Guardian was advised if they have not already done so to contact the registration department to sign all necessary forms in order for [place] to release information regarding their care.   Consent: Patient/Guardian gives verbal consent for treatment and assignment of benefits for services provided during this visit. Patient/Guardian expressed understanding and agreed to proceed.   PGY-3 Freida Busman, MD 07/05/2022, 11:09 AM

## 2022-07-07 NOTE — Plan of Care (Signed)
  Problem: Depression CCP Problem  1  Goal: LTG: Merl WILL SCORE LESS THAN 10 ON THE PATIENT HEALTH QUESTIONNAIRE (PHQ-9) Outcome: Not Progressing Goal: STG: Reduce overall depression score by a minimum of 25% on the Patient Health Questionnaire (PHQ-9) or the Montgomery-Asberg Depression Rating Scale (MADRS) Outcome: Not Progressing

## 2022-07-11 ENCOUNTER — Other Ambulatory Visit: Payer: Self-pay

## 2022-07-17 ENCOUNTER — Ambulatory Visit (HOSPITAL_COMMUNITY): Payer: Commercial Managed Care - HMO | Admitting: Clinical

## 2022-07-19 ENCOUNTER — Other Ambulatory Visit: Payer: Self-pay

## 2022-07-20 ENCOUNTER — Telehealth: Payer: Self-pay | Admitting: *Deleted

## 2022-07-20 NOTE — Telephone Encounter (Signed)
Interpreter assistance provided by [employer], Amela. ID# [id number]  Unable to reach. Will mail a letter requesting a follow up.

## 2022-07-20 NOTE — Telephone Encounter (Signed)
-----   Message from Charlott Rakes, MD sent at 06/27/2022  1:34 PM EDT ----- Please advise her that labs reveal she is anemic.  Advised to restart her iron tablets I had written for her at her visit yesterday. Thanks

## 2022-07-24 ENCOUNTER — Other Ambulatory Visit: Payer: Self-pay

## 2022-07-24 ENCOUNTER — Other Ambulatory Visit (HOSPITAL_COMMUNITY): Payer: Self-pay | Admitting: Student in an Organized Health Care Education/Training Program

## 2022-07-24 ENCOUNTER — Ambulatory Visit (INDEPENDENT_AMBULATORY_CARE_PROVIDER_SITE_OTHER): Payer: Commercial Managed Care - HMO | Admitting: Student in an Organized Health Care Education/Training Program

## 2022-07-24 DIAGNOSIS — F411 Generalized anxiety disorder: Secondary | ICD-10-CM

## 2022-07-24 DIAGNOSIS — F4321 Adjustment disorder with depressed mood: Secondary | ICD-10-CM | POA: Diagnosis not present

## 2022-07-24 DIAGNOSIS — T50905A Adverse effect of unspecified drugs, medicaments and biological substances, initial encounter: Secondary | ICD-10-CM

## 2022-07-24 DIAGNOSIS — F310 Bipolar disorder, current episode hypomanic: Secondary | ICD-10-CM

## 2022-07-24 DIAGNOSIS — Z634 Disappearance and death of family member: Secondary | ICD-10-CM

## 2022-07-24 DIAGNOSIS — G2571 Drug induced akathisia: Secondary | ICD-10-CM

## 2022-07-24 MED ORDER — OLANZAPINE 10 MG PO TABS
10.0000 mg | ORAL_TABLET | Freq: Every day | ORAL | 2 refills | Status: DC
  Filled 2022-07-24: qty 30, 30d supply, fill #0

## 2022-07-24 MED ORDER — LITHIUM CARBONATE 300 MG PO TABS
300.0000 mg | ORAL_TABLET | Freq: Two times a day (BID) | ORAL | 2 refills | Status: DC
  Filled 2022-07-24: qty 60, 30d supply, fill #0
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
## 2022-07-26 LAB — SPECIMEN STATUS REPORT

## 2022-07-27 LAB — BASIC METABOLIC PANEL
BUN/Creatinine Ratio: 27 — ABNORMAL HIGH (ref 9–23)
BUN: 21 mg/dL (ref 6–24)
CO2: 21 mmol/L (ref 20–29)
Calcium: 9 mg/dL (ref 8.7–10.2)
Chloride: 101 mmol/L (ref 96–106)
Creatinine, Ser: 0.78 mg/dL (ref 0.57–1.00)
Glucose: 93 mg/dL (ref 70–99)
Potassium: 4.5 mmol/L (ref 3.5–5.2)
Sodium: 141 mmol/L (ref 134–144)
eGFR: 92 mL/min/{1.73_m2} (ref >59)

## 2022-07-30 LAB — CBC WITH DIFFERENTIAL/PLATELET
Basophils Absolute: 0.1 10*3/uL (ref 0.0–0.2)
Basos: 1 %
EOS (ABSOLUTE): 0.3 10*3/uL (ref 0.0–0.4)
Eos: 3 %
Hematocrit: 32.7 % — ABNORMAL LOW (ref 34.0–46.6)
Hemoglobin: 9.4 g/dL — ABNORMAL LOW (ref 11.1–15.9)
Immature Grans (Abs): 0 10*3/uL (ref 0.0–0.1)
Immature Granulocytes: 0 %
Lymphocytes Absolute: 2.3 10*3/uL (ref 0.7–3.1)
Lymphs: 28 %
MCH: 20.3 pg — ABNORMAL LOW (ref 26.6–33.0)
MCHC: 28.7 g/dL — ABNORMAL LOW (ref 31.5–35.7)
MCV: 71 fL — ABNORMAL LOW (ref 79–97)
Monocytes Absolute: 0.6 10*3/uL (ref 0.1–0.9)
Monocytes: 8 %
Neutrophils Absolute: 5 10*3/uL (ref 1.4–7.0)
Neutrophils: 60 %
Platelets: 439 10*3/uL (ref 150–450)
RBC: 4.64 x10E6/uL (ref 3.77–5.28)
RDW: 18.5 % — ABNORMAL HIGH (ref 11.7–15.4)
WBC: 8.3 10*3/uL (ref 3.4–10.8)

## 2022-07-30 LAB — LITHIUM LEVEL: Lithium Lvl: <0.1 mmol/L — ABNORMAL LOW (ref 0.5–1.2)

## 2022-07-30 LAB — SPECIMEN STATUS REPORT

## 2022-08-28 ENCOUNTER — Ambulatory Visit (INDEPENDENT_AMBULATORY_CARE_PROVIDER_SITE_OTHER): Payer: Commercial Managed Care - HMO | Admitting: Clinical

## 2022-08-28 DIAGNOSIS — F4321 Adjustment disorder with depressed mood: Secondary | ICD-10-CM
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
## 2022-09-04 ENCOUNTER — Other Ambulatory Visit: Payer: Self-pay

## 2022-09-04 ENCOUNTER — Other Ambulatory Visit (HOSPITAL_COMMUNITY): Payer: Self-pay | Admitting: Student in an Organized Health Care Education/Training Program

## 2022-09-04 ENCOUNTER — Telehealth (HOSPITAL_COMMUNITY): Payer: Self-pay

## 2022-09-04 ENCOUNTER — Ambulatory Visit (HOSPITAL_COMMUNITY): Payer: Commercial Managed Care - HMO | Admitting: Student in an Organized Health Care Education/Training Program

## 2022-09-04 VITALS — BP 120/80 | HR 80 | Resp 12 | Wt 147.0 lb

## 2022-09-04 DIAGNOSIS — F3132 Bipolar disorder, current episode depressed, moderate: Secondary | ICD-10-CM

## 2022-09-04 DIAGNOSIS — F310 Bipolar disorder, current episode hypomanic: Secondary | ICD-10-CM

## 2022-09-04 DIAGNOSIS — F411 Generalized anxiety disorder: Secondary | ICD-10-CM

## 2022-09-04 DIAGNOSIS — T50905A Adverse effect of unspecified drugs, medicaments and biological substances, initial encounter: Secondary | ICD-10-CM

## 2022-09-04 MED ORDER — LITHIUM CARBONATE 300 MG PO TABS
300.0000 mg | ORAL_TABLET | Freq: Two times a day (BID) | ORAL | 2 refills | Status: DC
  Filled 2022-09-04: qty 60, 30d supply, fill #0

## 2022-09-04 MED ORDER — BUSPIRONE HCL 30 MG PO TABS
15.0000 mg | ORAL_TABLET | Freq: Two times a day (BID) | ORAL | 2 refills | Status: DC
  Filled 2022-09-04: qty 30, 30d supply, fill #0

## 2022-09-04 MED ORDER — OLANZAPINE 10 MG PO TABS
10.0000 mg | ORAL_TABLET | Freq: Every day | ORAL | 2 refills | Status: DC
  Filled 2022-09-04: qty 30, 30d supply, fill #0

## 2022-09-04 NOTE — Plan of Care (Signed)
  Problem: Depression CCP Problem  1  Goal: LTG: Javana WILL SCORE LESS THAN 10 ON THE PATIENT HEALTH QUESTIONNAIRE (PHQ-9) Outcome: Not Progressing Goal: STG: Reduce overall depression score by a minimum of 25% on the Patient Health Questionnaire (PHQ-9) or the Montgomery-Asberg Depression Rating Scale (MADRS) Outcome: Not Progressing

## 2022-09-04 NOTE — Progress Notes (Unsigned)
Patient presents to lab for blood draw, Basic Metabolic Panel , Lithium level , CBC with Differential. Labs were drawn from right arm with no issues By Griffith Citron MA
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
## 2022-09-05 ENCOUNTER — Other Ambulatory Visit: Payer: Self-pay

## 2022-09-06 LAB — CBC WITH DIFFERENTIAL/PLATELET
Basophils Absolute: 0.1 10*3/uL (ref 0.0–0.2)
Basos: 1 %
EOS (ABSOLUTE): 0.2 10*3/uL (ref 0.0–0.4)
Eos: 2 %
Hematocrit: 32.8 % — ABNORMAL LOW (ref 34.0–46.6)
Hemoglobin: 9.4 g/dL — ABNORMAL LOW (ref 11.1–15.9)
Immature Grans (Abs): 0 10*3/uL (ref 0.0–0.1)
Immature Granulocytes: 0 %
Lymphocytes Absolute: 2.1 10*3/uL (ref 0.7–3.1)
Lymphs: 27 %
MCH: 19.8 pg — ABNORMAL LOW (ref 26.6–33.0)
MCHC: 28.7 g/dL — ABNORMAL LOW (ref 31.5–35.7)
MCV: 69 fL — ABNORMAL LOW (ref 79–97)
Monocytes Absolute: 0.6 10*3/uL (ref 0.1–0.9)
Monocytes: 8 %
Neutrophils Absolute: 4.8 10*3/uL (ref 1.4–7.0)
Neutrophils: 62 %
Platelets: 448 10*3/uL (ref 150–450)
RBC: 4.74 x10E6/uL (ref 3.77–5.28)
RDW: 18.9 % — ABNORMAL HIGH (ref 11.7–15.4)
WBC: 7.7 10*3/uL (ref 3.4–10.8)

## 2022-09-06 LAB — COMPREHENSIVE METABOLIC PANEL
ALT: 18 IU/L (ref 0–32)
AST: 14 IU/L (ref 0–40)
Albumin/Globulin Ratio: 1.5 (ref 1.2–2.2)
Albumin: 4.4 g/dL (ref 3.9–4.9)
Alkaline Phosphatase: 100 IU/L (ref 44–121)
BUN/Creatinine Ratio: 25 — ABNORMAL HIGH (ref 9–23)
BUN: 16 mg/dL (ref 6–24)
Bilirubin Total: <0.2 mg/dL (ref 0.0–1.2)
CO2: 19 mmol/L — ABNORMAL LOW (ref 20–29)
Calcium: 8.8 mg/dL (ref 8.7–10.2)
Chloride: 102 mmol/L (ref 96–106)
Creatinine, Ser: 0.64 mg/dL (ref 0.57–1.00)
Globulin, Total: 2.9 g/dL (ref 1.5–4.5)
Glucose: 91 mg/dL (ref 70–99)
Potassium: 4.4 mmol/L (ref 3.5–5.2)
Sodium: 139 mmol/L (ref 134–144)
Total Protein: 7.3 g/dL (ref 6.0–8.5)
eGFR: 108 mL/min/{1.73_m2} (ref >59)

## 2022-09-06 LAB — SPECIMEN STATUS REPORT

## 2022-09-07 ENCOUNTER — Encounter: Payer: Self-pay | Admitting: Obstetrics and Gynecology

## 2022-09-07 ENCOUNTER — Other Ambulatory Visit (HOSPITAL_COMMUNITY)
Admission: RE | Admit: 2022-09-07 | Discharge: 2022-09-07 | Disposition: A | Discharge status: Home / Self Care | Payer: Commercial Managed Care - HMO | Source: Ambulatory Visit | Attending: Obstetrics and Gynecology | Admitting: Obstetrics and Gynecology

## 2022-09-07 ENCOUNTER — Ambulatory Visit: Payer: Commercial Managed Care - HMO | Admitting: Obstetrics and Gynecology

## 2022-09-07 ENCOUNTER — Other Ambulatory Visit: Payer: Self-pay

## 2022-09-07 VITALS — BP 114/82 | HR 76 | Ht 64.0 in | Wt 149.2 lb

## 2022-09-07 DIAGNOSIS — D259 Leiomyoma of uterus, unspecified: Secondary | ICD-10-CM | POA: Diagnosis not present

## 2022-09-07 DIAGNOSIS — N938 Other specified abnormal uterine and vaginal bleeding: Secondary | ICD-10-CM | POA: Diagnosis not present

## 2022-09-07 DIAGNOSIS — Z3202 Encounter for pregnancy test, result negative: Secondary | ICD-10-CM | POA: Diagnosis not present

## 2022-09-07 DIAGNOSIS — Z124 Encounter for screening for malignant neoplasm of cervix: Secondary | ICD-10-CM | POA: Insufficient documentation

## 2022-09-07 LAB — POCT PREGNANCY, URINE: Preg Test, Ur: NEGATIVE

## 2022-09-07 NOTE — Progress Notes (Signed)
Called Pt using [place] id# [id number], to advise Pt os U/S scheduled for 09/13/22 @ 2:30p & to arrive at 2:00p w/ full bladder. Pt verbalized understanding.

## 2022-09-07 NOTE — Progress Notes (Signed)
    CC: fibroids Subjective:    Patient ID: Gina Burton, female    DOB: 1971/09/17, 51 y.o.   MRN: [id number]  HPI 51 yo G4P4 , SVD x 4, seen for discussion and evaluation of fibroid uterus.  Pt notes she sometimes bleeds 4 times a month and otherwise has irregular spotting.  She has felt like her stomach has gotten larger over the last 3 months.  Fibroids were confirmed on previous 2022 ultrasound.  She notes she has had irregular bleeding the last 5-6 years.  Pt denies any hot flashes or night sweats.   Review of Systems     Objective:   Physical Exam Constitutional:      Appearance: Normal appearance. She is normal weight.  HENT:     Head: Normocephalic and atraumatic.  Cardiovascular:     Rate and Rhythm: Normal rate and regular rhythm.     Heart sounds: Normal heart sounds.  Pulmonary:     Effort: Pulmonary effort is normal.     Breath sounds: Normal breath sounds.  Abdominal:     General: Abdomen is flat. There is no distension.     Palpations: Abdomen is soft. There is no mass.     Tenderness: There is no abdominal tenderness.  Genitourinary:    Comments: SSE: normal vagina and cervix noted.  Small inclusion cyst on right labia minora Pap taken with chaperone present.  SVE: slightly enlarged mobile, nontender uterus. Neurological:     Mental Status: She is alert.    Vitals:   09/07/22 0840  BP: 114/82  Pulse: 76   ENDOMETRIAL BIOPSY      Gina Burton Zeb Comfort is a 51 y.o. [id number] here for endometrial biopsy.  The indications for endometrial biopsy were reviewed.  Risks of the biopsy including cramping, bleeding, infection, uterine perforation, inadequate specimen and need for additional procedures were discussed. The patient states she understands and agrees to undergo procedure today. Consent was signed. Time out was performed.   Indications: AUB Urine HCG: neg  A bivalve speculum was placed into the vagina and the cervix was easily  visualized and was prepped with Betadine x2. A single-toothed tenaculum was placed on the anterior lip of the cervix to stabilize it. The 3 mm pipelle was introduced into the endometrial cavity without difficulty to a depth of 10 cm, and a moderate amount of tissue was obtained and sent to pathology. This was repeated for a total of 3 passes. The instruments were removed from the patient's vagina. Minimal bleeding from the cervix at the tenaculum was noted.   The patient tolerated the procedure well. Routine post-procedure instructions were given to the patient.    Will base further management on results of biopsy.       Assessment & Plan:   1. Uterine leiomyoma, unspecified location Will get current ultrasound as physical exam does not reflect 8 cm fibroid noted on previous scan Discussed multiple treatment options including expectant management, [place], UFE or hyst. - US PELVIC COMPLETE WITH TRANSVAGINAL; Future  2. DUB (dysfunctional uterine bleeding) Endometrial biopsy taken, see above - Surgical pathology( [place]/ [facility]) - US PELVIC COMPLETE WITH TRANSVAGINAL; Future  3. Cervical cancer screening Pt had not had pap for several years - Cytology - PAP  F/u in 6 weeks to discuss results and future plan  Griffin Basil, MD Faculty Attending, [facility]

## 2022-09-07 NOTE — Progress Notes (Signed)
NGYN presents for fibroids. Pt is premenopausal and states periods are very irregular. Pt states she has not has a PAP smear in years. Pt states her belly has been growing over 3 months and she feels she may be pregnant.   Last Pelvic US 06-22-21 pt did not f/u after [place].

## 2022-09-11 LAB — CYTOLOGY - PAP
Comment: NEGATIVE
Diagnosis: NEGATIVE
High risk HPV: NEGATIVE

## 2022-09-11 LAB — SURGICAL PATHOLOGY

## 2022-09-12 ENCOUNTER — Ambulatory Visit (HOSPITAL_COMMUNITY): Payer: Commercial Managed Care - HMO | Admitting: Clinical

## 2022-09-13 ENCOUNTER — Ambulatory Visit (HOSPITAL_COMMUNITY): Payer: Commercial Managed Care - HMO

## 2022-09-13 LAB — LITHIUM LEVEL: Lithium Lvl: <0.1 mmol/L — ABNORMAL LOW (ref 0.5–1.2)

## 2022-09-21 ENCOUNTER — Ambulatory Visit (INDEPENDENT_AMBULATORY_CARE_PROVIDER_SITE_OTHER): Payer: Commercial Managed Care - HMO | Admitting: Clinical

## 2022-09-21 DIAGNOSIS — F310 Bipolar disorder, current episode hypomanic: Secondary | ICD-10-CM
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
## 2022-09-22 NOTE — Plan of Care (Signed)
  Problem: Depression CCP Problem  1  Goal: LTG: Desire WILL SCORE LESS THAN 10 ON THE PATIENT HEALTH QUESTIONNAIRE (PHQ-9) Outcome: Progressing Goal: STG: Reduce overall depression score by a minimum of 25% on the Patient Health Questionnaire (PHQ-9) or the Montgomery-Asberg Depression Rating Scale (MADRS) Outcome: Progressing

## 2022-10-08 ENCOUNTER — Other Ambulatory Visit: Payer: Self-pay

## 2022-10-08 ENCOUNTER — Ambulatory Visit (INDEPENDENT_AMBULATORY_CARE_PROVIDER_SITE_OTHER): Payer: Commercial Managed Care - HMO | Admitting: Student in an Organized Health Care Education/Training Program

## 2022-10-08 VITALS — BP 134/89 | HR 89 | Resp 20 | Wt 150.0 lb

## 2022-10-08 DIAGNOSIS — F411 Generalized anxiety disorder: Secondary | ICD-10-CM | POA: Diagnosis not present

## 2022-10-08 DIAGNOSIS — F3132 Bipolar disorder, current episode depressed, moderate: Secondary | ICD-10-CM

## 2022-10-08 DIAGNOSIS — F310 Bipolar disorder, current episode hypomanic: Secondary | ICD-10-CM

## 2022-10-08 DIAGNOSIS — R454 Irritability and anger: Secondary | ICD-10-CM | POA: Diagnosis not present

## 2022-10-08 DIAGNOSIS — G2571 Drug induced akathisia: Secondary | ICD-10-CM

## 2022-10-08 DIAGNOSIS — T50905A Adverse effect of unspecified drugs, medicaments and biological substances, initial encounter: Secondary | ICD-10-CM

## 2022-10-08 DIAGNOSIS — F458 Other somatoform disorders: Secondary | ICD-10-CM

## 2022-10-08 MED ORDER — OLANZAPINE 5 MG PO TABS
5.0000 mg | ORAL_TABLET | Freq: Every day | ORAL | 2 refills | Status: DC
  Filled 2022-10-08 (×2): qty 30, 30d supply, fill #0

## 2022-10-08 MED ORDER — OLANZAPINE 10 MG PO TABS
10.0000 mg | ORAL_TABLET | Freq: Every day | ORAL | 2 refills | Status: DC
  Filled 2022-10-08: qty 30, 30d supply, fill #0

## 2022-10-08 MED ORDER — CLONIDINE HCL 0.1 MG PO TABS
0.1000 mg | ORAL_TABLET | Freq: Two times a day (BID) | ORAL | 11 refills | Status: DC
  Filled 2022-10-08: qty 60, 30d supply, fill #0

## 2022-10-08 MED ORDER — OLANZAPINE 5 MG PO TABS
10.0000 mg | ORAL_TABLET | Freq: Every day | ORAL | 2 refills | Status: DC
  Filled 2022-10-08: qty 60, 30d supply, fill #0
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
## 2022-10-15 ENCOUNTER — Ambulatory Visit (HOSPITAL_COMMUNITY): Payer: Commercial Managed Care - HMO | Admitting: Clinical

## 2022-10-19 ENCOUNTER — Ambulatory Visit: Payer: Commercial Managed Care - HMO | Admitting: Obstetrics and Gynecology

## 2022-10-30 ENCOUNTER — Ambulatory Visit (HOSPITAL_COMMUNITY): Payer: Commercial Managed Care - HMO | Admitting: Clinical

## 2022-11-13 ENCOUNTER — Encounter (HOSPITAL_COMMUNITY): Payer: No Payment, Other | Admitting: Student in an Organized Health Care Education/Training Program

## 2022-11-20 ENCOUNTER — Encounter (HOSPITAL_COMMUNITY): Payer: No Payment, Other | Admitting: Student in an Organized Health Care Education/Training Program

## 2022-12-07 ENCOUNTER — Encounter (HOSPITAL_COMMUNITY): Payer: Self-pay | Admitting: Student in an Organized Health Care Education/Training Program

## 2022-12-07 ENCOUNTER — Other Ambulatory Visit: Payer: Self-pay

## 2022-12-07 ENCOUNTER — Ambulatory Visit (INDEPENDENT_AMBULATORY_CARE_PROVIDER_SITE_OTHER): Payer: Commercial Managed Care - HMO | Admitting: Student in an Organized Health Care Education/Training Program

## 2022-12-07 DIAGNOSIS — R454 Irritability and anger: Secondary | ICD-10-CM | POA: Diagnosis not present

## 2022-12-07 DIAGNOSIS — F411 Generalized anxiety disorder: Secondary | ICD-10-CM

## 2022-12-07 DIAGNOSIS — F3132 Bipolar disorder, current episode depressed, moderate: Secondary | ICD-10-CM

## 2022-12-07 DIAGNOSIS — F310 Bipolar disorder, current episode hypomanic: Secondary | ICD-10-CM | POA: Diagnosis not present

## 2022-12-07 MED ORDER — OLANZAPINE 10 MG PO TABS
10.0000 mg | ORAL_TABLET | Freq: Every day | ORAL | 2 refills | Status: DC
  Filled 2022-12-07: qty 30, 30d supply, fill #0

## 2022-12-07 MED ORDER — CLONIDINE HCL 0.1 MG PO TABS
0.1000 mg | ORAL_TABLET | Freq: Two times a day (BID) | ORAL | 11 refills | Status: AC
  Filled 2022-12-07 (×2): qty 60, 30d supply, fill #0

## 2022-12-07 MED ORDER — OLANZAPINE 5 MG PO TABS
5.0000 mg | ORAL_TABLET | Freq: Every day | ORAL | 2 refills | Status: DC
  Filled 2022-12-07: qty 30, 30d supply, fill #0

## 2022-12-07 MED ORDER — BUSPIRONE HCL 15 MG PO TABS
15.0000 mg | ORAL_TABLET | Freq: Every day | ORAL | 3 refills | Status: DC
  Filled 2022-12-07: qty 30, 30d supply, fill #0
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
## 2023-01-25 ENCOUNTER — Encounter (HOSPITAL_COMMUNITY): Payer: No Payment, Other | Admitting: Student in an Organized Health Care Education/Training Program

## 2023-05-03 ENCOUNTER — Ambulatory Visit (INDEPENDENT_AMBULATORY_CARE_PROVIDER_SITE_OTHER): Payer: No Payment, Other | Admitting: Mental Health

## 2023-05-03 DIAGNOSIS — F4321 Adjustment disorder with depressed mood: Secondary | ICD-10-CM

## 2023-05-03 DIAGNOSIS — F3132 Bipolar disorder, current episode depressed, moderate: Secondary | ICD-10-CM

## 2023-05-03 DIAGNOSIS — F411 Generalized anxiety disorder: Secondary | ICD-10-CM
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
## 2023-06-10 ENCOUNTER — Ambulatory Visit (HOSPITAL_COMMUNITY): Payer: Self-pay | Admitting: Mental Health

## 2023-06-12 ENCOUNTER — Ambulatory Visit (HOSPITAL_COMMUNITY): Payer: No Payment, Other | Admitting: Mental Health

## 2023-07-09 NOTE — Telephone Encounter (Signed)
Open in error

## 2024-03-09 ENCOUNTER — Encounter (HOSPITAL_COMMUNITY): Payer: Self-pay

## 2024-03-09 ENCOUNTER — Other Ambulatory Visit: Payer: Self-pay

## 2024-03-09 ENCOUNTER — Emergency Department (HOSPITAL_COMMUNITY)
Admission: EM | Admit: 2024-03-09 | Discharge: 2024-03-09 | Disposition: A | Discharge status: Home / Self Care | Payer: Self-pay | Attending: Emergency Medicine | Admitting: Emergency Medicine

## 2024-03-09 ENCOUNTER — Emergency Department (HOSPITAL_COMMUNITY): Payer: Self-pay

## 2024-03-09 DIAGNOSIS — B349 Viral infection, unspecified: Secondary | ICD-10-CM | POA: Insufficient documentation

## 2024-03-09 DIAGNOSIS — M79671 Pain in right foot: Secondary | ICD-10-CM | POA: Insufficient documentation

## 2024-03-09 DIAGNOSIS — J45909 Unspecified asthma, uncomplicated: Secondary | ICD-10-CM | POA: Insufficient documentation

## 2024-03-09 LAB — COMPREHENSIVE METABOLIC PANEL
ALT: 15 U/L (ref 0–44)
AST: 17 U/L (ref 15–41)
Albumin: 3.6 g/dL (ref 3.5–5.0)
Alkaline Phosphatase: 84 U/L (ref 38–126)
Anion gap: 10 (ref 5–15)
BUN: 22 mg/dL — ABNORMAL HIGH (ref 6–20)
CO2: 21 mmol/L — ABNORMAL LOW (ref 22–32)
Calcium: 8.9 mg/dL (ref 8.9–10.3)
Chloride: 106 mmol/L (ref 98–111)
Creatinine, Ser: 0.6 mg/dL (ref 0.44–1.00)
GFR, Estimated: >60 mL/min (ref >60)
Glucose, Bld: 107 mg/dL — ABNORMAL HIGH (ref 70–99)
Potassium: 3.3 mmol/L — ABNORMAL LOW (ref 3.5–5.1)
Sodium: 137 mmol/L (ref 135–145)
Total Bilirubin: 0.6 mg/dL (ref 0.0–1.2)
Total Protein: 7.1 g/dL (ref 6.5–8.1)

## 2024-03-09 LAB — CBC WITH DIFFERENTIAL/PLATELET
Abs Immature Granulocytes: 0.04 10*3/uL (ref 0.00–0.07)
Basophils Absolute: 0.1 10*3/uL (ref 0.0–0.1)
Basophils Relative: 1 %
Eosinophils Absolute: 0.2 10*3/uL (ref 0.0–0.5)
Eosinophils Relative: 3 %
HCT: 35.7 % — ABNORMAL LOW (ref 36.0–46.0)
Hemoglobin: 10.9 g/dL — ABNORMAL LOW (ref 12.0–15.0)
Immature Granulocytes: 1 %
Lymphocytes Relative: 25 %
Lymphs Abs: 2.2 10*3/uL (ref 0.7–4.0)
MCH: 23.4 pg — ABNORMAL LOW (ref 26.0–34.0)
MCHC: 30.5 g/dL (ref 30.0–36.0)
MCV: 76.8 fL — ABNORMAL LOW (ref 80.0–100.0)
Monocytes Absolute: 0.6 10*3/uL (ref 0.1–1.0)
Monocytes Relative: 7 %
Neutro Abs: 5.5 10*3/uL (ref 1.7–7.7)
Neutrophils Relative %: 63 %
Platelets: 286 10*3/uL (ref 150–400)
RBC: 4.65 MIL/uL (ref 3.87–5.11)
RDW: 18.8 % — ABNORMAL HIGH (ref 11.5–15.5)
WBC: 8.6 10*3/uL (ref 4.0–10.5)
nRBC: 0 % (ref 0.0–0.2)

## 2024-03-09 LAB — RESP PANEL BY RT-PCR (RSV, FLU A&B, COVID)  RVPGX2
Influenza A by PCR: NEGATIVE
Influenza B by PCR: NEGATIVE
Resp Syncytial Virus by PCR: NEGATIVE
SARS Coronavirus 2 by RT PCR: NEGATIVE

## 2024-03-09 LAB — URINALYSIS, MICROSCOPIC (REFLEX)

## 2024-03-09 LAB — HCG, SERUM, QUALITATIVE: Preg, Serum: NEGATIVE

## 2024-03-09 LAB — URINALYSIS, ROUTINE W REFLEX MICROSCOPIC
Bilirubin Urine: NEGATIVE
Glucose, UA: NEGATIVE mg/dL
Hgb urine dipstick: NEGATIVE
Ketones, ur: NEGATIVE mg/dL
Nitrite: NEGATIVE
Protein, ur: NEGATIVE mg/dL
Specific Gravity, Urine: >1.03 — ABNORMAL HIGH (ref 1.005–1.030)
pH: 5.5 (ref 5.0–8.0)

## 2024-03-09 LAB — SARS CORONAVIRUS 2 BY RT PCR: SARS Coronavirus 2 by RT PCR: NEGATIVE

## 2024-03-09 LAB — LIPASE, BLOOD: Lipase: 26 U/L (ref 11–51)

## 2024-03-09 MED ORDER — ONDANSETRON 4 MG PO TBDP
4.0000 mg | ORAL_TABLET | Freq: Once | ORAL | Status: AC
  Administered 2024-03-09: 4 mg via ORAL
  Filled 2024-03-09: qty 1

## 2024-03-09 MED ORDER — ONDANSETRON HCL 4 MG PO TABS
4.0000 mg | ORAL_TABLET | Freq: Four times a day (QID) | ORAL | 0 refills | Status: AC
  Filled 2024-03-09: qty 12, 3d supply, fill #0

## 2024-03-09 MED ORDER — ACETAMINOPHEN 325 MG PO TABS
650.0000 mg | ORAL_TABLET | Freq: Once | ORAL | Status: AC
  Administered 2024-03-09: 650 mg via ORAL
  Filled 2024-03-09: qty 2

## 2024-03-09 MED ORDER — POTASSIUM CHLORIDE CRYS ER 20 MEQ PO TBCR
40.0000 meq | EXTENDED_RELEASE_TABLET | Freq: Once | ORAL | Status: AC
  Administered 2024-03-09: 40 meq via ORAL
  Filled 2024-03-09: qty 2

## 2024-03-09 MED ORDER — IBUPROFEN 400 MG PO TABS
400.0000 mg | ORAL_TABLET | Freq: Once | ORAL | Status: AC
  Administered 2024-03-09: 400 mg via ORAL
  Filled 2024-03-09: qty 1

## 2024-03-09 NOTE — ED Triage Notes (Addendum)
 Pt c/o fever and bodyachesx2d. Pt states he temp was over 100. Pt states was vomiting today. Pt states vomited twice today

## 2024-03-09 NOTE — Discharge Instructions (Addendum)
 You were seen in the emergency department for your fever, nausea and foot pain.  You tested negative for COVID, flu and RSV. You like have another viral infection.  You did not require treatment with any antiviral medications can continue symptomatic treatment with Tylenol and Motrin as needed for fevers and bodyaches and both can be taken up to every 6 hours.  I have given you prescription for Zofran that you can take as needed for nausea.  You can try Mucinex or raw honey as needed for your cough and you can use a humidifier or hot steam from the shower to help with your congestion as well as over-the-counter and nasal decongestant sprays.  You can follow-up with your primary doctor in the next few days to have your symptoms rechecked.  Your x-ray of your foot also did not show any bony injury and you likely sprained your foot or have some inflammation in the ligaments.  You can ice your foot and keep it elevated and can also take Tylenol or Motrin as needed for this.  You can follow-up with podiatry as needed.  You should return to the emergency department for significantly worsening shortness of breath, severe chest pain, repetitive vomiting or if you have any other new or concerning symptoms.  Lo atendieron en urgencias por fiebre, nuseas y dolor de pie. Sus pruebas de COVID-19, gripe y VSR dieron negativo. Es posible que tenga otra infeccin viral. No requiri tratamiento antiviral; puede continuar con el tratamiento sintomtico con Tylenol y Motrin segn sea necesario para la fiebre y Orthoptist, y ambos pueden tomarse hasta cada 6 horas. Le he recetado Zofran, que puede tomar segn sea necesario para las nuseas. Puede probar Mucinex o miel cruda para la tos, y usar un humidificador o vapor caliente de la ducha para Technical sales engineer congestin, as como descongestionantes nasales y de Sales promotion account executive. Puede consultar con su mdico de cabecera en los prximos das para que le revisen los sntomas. La  radiografa del pie tampoco mostr ninguna lesin sea y es probable que se haya torcido o tenga inflamacin en los ligamentos. Puede aplicar hielo en el pie y Desmond Dike, y tambin puede tomar Tylenol o Motrin segn sea necesario. Puede consultar con un podlogo segn sea necesario. Debe regresar al departamento de emergencias si presenta dificultad para respirar que empeora significativamente, dolor de pecho intenso, vmitos repetitivos o si tiene otros sntomas nuevos o preocupantes.

## 2024-03-09 NOTE — ED Notes (Signed)
I  used interpreter to triage pt

## 2024-03-09 NOTE — ED Provider Notes (Signed)
 [facility]ERG[facility]Y DEPARTMENT AT [facility] Provider Note   CSN: [id number] Arrival date & time: 03/09/24  1320     History  No chief complaint on file.   Gina Burton is a 53 y.o. female.  Patient is a 53 year old female with a past medical history of asthma and bipolar disorder presenting to the emergency department with fevers as well as right foot pain.  The patient states for the last few days she has had fevers of 100 F.  She states that she has had an associated cough, congestion and sore throat.  Also reports that she has had some shortness of breath at night.  She reports some chest tightness.  She states that she has been nauseous and has vomited.  Denies any abdominal pain.  Denies any diarrhea or constipation.  Denies any dysuria or hematuria.  Patient also reports for the last 2 days she has had pain in her top of her right foot.  She denies any trauma or falls.  She states that her foot is felt swollen.  The history is provided by the patient. A language interpreter was used Zachary George ID [phone]).       Home Medications Prior to Admission medications   Medication Sig Start Date End Date Taking? Authorizing Provider  ondansetron (ZOFRAN) 4 MG tablet Take 1 tablet (4 mg total) by mouth every 6 (six) hours. 03/09/24  Yes Theresia Lo, Benetta Spar K, DO  albuterol (VENTOLIN HFA) 108 (90 Base) MCG/ACT inhaler Inhale 2 puffs into the lungs every 6 (six) hours as needed for wheezing or shortness of breath. Patient not taking: Reported on 01/31/2022 11/30/21   Marcine Matar, MD  busPIRone (BUSPAR) 15 MG tablet Take 1 tablet (15 mg total) by mouth at bedtime. 12/07/22   Bobbye Morton, MD  cloNIDine (CATAPRES) 0.1 MG tablet Take 1 tablet (0.1 mg total) by mouth 2 (two) times daily. 12/07/22   Bobbye Morton, MD  COVID-19 mRNA vaccine, Moderna, 100 MCG/0.5ML injection Inject into the muscle. Patient not taking: Reported on 01/31/2022 04/19/21   Judyann Munson, MD  ferrous sulfate 325 (65 FE) MG tablet TAKE 1 TABLET BY MOUTH IN THE MORNING AND AT BEDTIME. 06/26/22 06/26/23  Hoy Register, MD  hydrOXYzine (ATARAX) 25 MG tablet Take 1 tablet (25 mg total) by mouth 3 (three) times daily as needed. 03/16/22   Nwoko, Tommas Olp, PA  OLANZapine (ZYPREXA) 10 MG tablet Take 1 tablet (10 mg total) by mouth at bedtime. 12/07/22   Bobbye Morton, MD  OLANZapine (ZYPREXA) 5 MG tablet Take 1 tablet (5 mg total) by mouth daily in the morning. 12/07/22   Bobbye Morton, MD      Allergies    Seroquel [quetiapine]    Review of Systems   Review of Systems  Physical Exam Updated Vital Signs BP 116/76   Pulse 64   Temp 98 F (36.7 C) (Oral)   Resp 18   Ht 5\' 4"  (1.626 m)   Wt 68 kg   SpO2 95%   BMI 25.73 kg/m  Physical Exam Vitals and nursing note reviewed.  Constitutional:      General: She is not in acute distress.    Appearance: Normal appearance.  HENT:     Head: Normocephalic and atraumatic.     Nose: Nose normal.     Mouth/Throat:     Mouth: Mucous membranes are moist.     Pharynx: Posterior oropharyngeal erythema present. No oropharyngeal  exudate.     Comments: Uvula midline No tonsillar swelling or exudates Eyes:     Extraocular Movements: Extraocular movements intact.     Conjunctiva/sclera: Conjunctivae normal.  Cardiovascular:     Rate and Rhythm: Normal rate and regular rhythm.     Pulses: Normal pulses.     Heart sounds: Normal heart sounds.  Pulmonary:     Effort: Pulmonary effort is normal.     Breath sounds: Normal breath sounds.  Abdominal:     General: Abdomen is flat.     Palpations: Abdomen is soft.     Tenderness: There is no abdominal tenderness.  Musculoskeletal:        General: Normal range of motion.     Cervical back: Normal range of motion.     Comments: Tenderness to palpation of R mid foot on plantar and dorsal aspect, no significant swelling, no deformity  Skin:    General: Skin is warm and dry.   Neurological:     General: No focal deficit present.     Mental Status: She is alert and oriented to person, place, and time.  Psychiatric:        Mood and Affect: Mood normal.        Behavior: Behavior normal.     ED Results / Procedures / Treatments   Labs (all labs ordered are listed, but only abnormal results are displayed) Labs Reviewed  COMPREHENSIVE METABOLIC PANEL - Abnormal; Notable for the following components:      Result Value   Potassium 3.3 (*)    CO2 21 (*)    Glucose, Bld 107 (*)    BUN 22 (*)    All other components within normal limits  CBC WITH DIFFERENTIAL/PLATELET - Abnormal; Notable for the following components:   Hemoglobin 10.9 (*)    HCT 35.7 (*)    MCV 76.8 (*)    MCH 23.4 (*)    RDW 18.8 (*)    All other components within normal limits  URINALYSIS, ROUTINE W REFLEX MICROSCOPIC - Abnormal; Notable for the following components:   Specific Gravity, Urine >1.030 (*)    Leukocytes,Ua TRACE (*)    All other components within normal limits  URINALYSIS, MICROSCOPIC (REFLEX) - Abnormal; Notable for the following components:   Bacteria, UA FEW (*)    All other components within normal limits  SARS CORONAVIRUS 2 BY RT PCR  RESP PANEL BY RT-PCR (RSV, FLU A&B, COVID)  RVPGX2  LIPASE, BLOOD  HCG, SERUM, QUALITATIVE    EKG EKG Interpretation Date/Time:  Monday March 09 2024 16:30:32 EDT Ventricular Rate:  55 PR Interval:  198 QRS Duration:  83 QT Interval:  476 QTC Calculation: 456 R Axis:   42  Text Interpretation: Sinus rhythm No significant change since last tracing Confirmed by Elayne Snare ([id number]) on 03/09/2024 5:30:00 PM  Radiology DG Foot Complete Right Result Date: 03/09/2024 CLINICAL DATA:  SOB.  Fever and body aches. EXAM: RIGHT FOOT COMPLETE - 3+ VIEW COMPARISON:  06/28/2019. FINDINGS: No acute fracture or dislocation. No aggressive osseous lesion. Ankle mortise appears intact. No significant arthritis of imaged joints. No focal soft  tissue swelling. No radiopaque foreign bodies. IMPRESSION: No acute osseous abnormality of the right foot. Electronically Signed   By: Jules Schick M.D.   On: 03/09/2024 17:03   DG Chest 2 View Result Date: 03/09/2024 CLINICAL DATA:  Shortness of breath. EXAM: CHEST - 2 VIEW COMPARISON:  01/31/2022. FINDINGS: Bilateral lung fields are clear. Bilateral costophrenic angles  are clear. Normal cardio-mediastinal silhouette. No acute osseous abnormalities. The soft tissues are within normal limits. IMPRESSION: No active cardiopulmonary disease. Electronically Signed   By: Jules Schick M.D.   On: 03/09/2024 17:01    Procedures Procedures    Medications Ordered in ED Medications  acetaminophen (TYLENOL) tablet 650 mg (650 mg Oral Given 03/09/24 1617)  ibuprofen (ADVIL) tablet 400 mg (400 mg Oral Given 03/09/24 1617)  ondansetron (ZOFRAN-ODT) disintegrating tablet 4 mg (4 mg Oral Given 03/09/24 1618)  potassium chloride SA (KLOR-CON M) CR tablet 40 mEq (40 mEq Oral Given 03/09/24 1616)    ED Course/ Medical Decision Making/ A&P Clinical Course as of 03/09/24 1818  Mon Mar 09, 2024  1729 Viral swab negative, no acute disease on XR. [VK]  1815 Patient is stable for discharge home. Will be given PCP and podiatry follow up as needed. [VK]    Clinical Course User Index [VK] Rexford Maus, DO                                 Medical Decision Making This patient presents to the ED with chief complaint(s) of fever, foot pain with pertinent past medical history of asthma, bipolar disorder which further complicates the presenting complaint. The complaint involves an extensive differential diagnosis and also carries with it a high risk of complications and morbidity.    The differential diagnosis includes viral syndrome, pneumonia, pneumothorax, pulmonary edema, pleural effusion, pericarditis, no periods, dehydration, electrolyte abnormality, gastritis, GERD, gastroenteritis, foot sprain, fracture,  no joint swelling and ROM intact making septic joint unlikely  Additional history obtained: Additional history obtained from N/A Records reviewed Primary Care Documents  ED Course and Reassessment: On patient's arrival she is afebrile and hemodynamically stable in no acute distress without any wheezing on exam.  She had labs initiated in triage that showed mild hypokalemia and otherwise is at baseline without acute abnormality.  Will additionally have EKG, chest x-ray and right foot x-ray in the setting of her shortness of breath and foot pain.  Will additionally add on flu swab.  Patient was given Tylenol, Motrin and Zofran for symptomatic management and will be closely reassessed.  Independent labs interpretation:  The following labs were independently interpreted: mild hypokalemia, otherwise within normal range  Independent visualization of imaging: - I independently visualized the following imaging with scope of interpretation limited to determining acute life threatening conditions related to emergency care: CXR, R foot XR, which revealed no acute disease  Consultation: - Consulted or discussed management/test interpretation w/ external professional: N/A  Consideration for admission or further workup: Patient has no emergent conditions requiring admission or further work-up at this time and is stable for discharge home with primary care follow-up  Social Determinants of health: N/A    Amount and/or Complexity of Data Reviewed Labs: ordered. Radiology: ordered.  Risk OTC drugs. Prescription drug management.          Final Clinical Impression(s) / ED Diagnoses Final diagnoses:  Viral syndrome  Right foot pain    Rx / DC Orders ED Discharge Orders          Ordered    ondansetron (ZOFRAN) 4 MG tablet  Every 6 hours        03/09/24 1817              Rexford Maus, DO 03/09/24 1818

## 2024-03-10 ENCOUNTER — Other Ambulatory Visit: Payer: Self-pay

## 2024-03-23 ENCOUNTER — Other Ambulatory Visit: Payer: Self-pay

## 2024-03-23 ENCOUNTER — Ambulatory Visit (INDEPENDENT_AMBULATORY_CARE_PROVIDER_SITE_OTHER): Payer: Self-pay | Admitting: Nurse Practitioner

## 2024-03-23 ENCOUNTER — Encounter: Payer: Self-pay | Admitting: Nurse Practitioner

## 2024-03-23 VITALS — BP 114/75 | HR 61 | Temp 98.1°F | Wt 147.8 lb

## 2024-03-23 DIAGNOSIS — M79671 Pain in right foot: Secondary | ICD-10-CM

## 2024-03-23 MED ORDER — PREDNISONE 20 MG PO TABS
20.0000 mg | ORAL_TABLET | Freq: Every day | ORAL | 0 refills | Status: AC
Start: 2024-03-23 — End: 2024-03-28
  Filled 2024-03-23: qty 5, 5d supply, fill #0

## 2024-03-23 MED ORDER — KETOROLAC TROMETHAMINE 30 MG/ML IJ SOLN
30.0000 mg | Freq: Once | INTRAMUSCULAR | Status: AC
  Administered 2024-03-23: 30 mg via INTRAMUSCULAR

## 2024-03-23 NOTE — Progress Notes (Signed)
 Subjective   Patient ID: Gina Burton, female    DOB: [birth year], 53 y.o.   MRN: [id number]  Chief Complaint  Patient presents with   Hospitalization Follow-up    Pain in leg is still there per patient     Referring provider: Marcine Matar, MD  Gina Burton is a 53 y.o. female with Past Medical History: No date: Asthma No date: Bipolar disorder, current episode hypomanic [facility])   HPI  Patient presents today for a hospital follow-up.  She was recently seen in the hospital for right foot pain.  She states that she is still having this issue has been ongoing for several weeks now.  We will place a referral for her to podiatry.  We will order prednisone and give Toradol injection in office today.  She does not have any open areas to her skin.  She does not have any redness or swelling.  Denies f/c/s, n/v/d, hemoptysis, PND, leg swelling Denies chest pain or edema     Allergies  Allergen Reactions   Seroquel [Quetiapine]     Severe akathisia    Immunization History  Administered Date(s) Administered   Influenza,inj,Quad PF,6+ Mos 10/26/2021   Moderna SARS-COV2 Booster Vaccination 04/19/2021   Moderna Sars-Covid-2 Vaccination 08/31/2020, 09/28/2020   PNEUMOCOCCAL CONJUGATE-20 10/26/2021   Tdap 02/15/2016, 11/14/2018    Tobacco History: Social History   Tobacco Use  Smoking Status Never  Smokeless Tobacco Never   Counseling given: Yes   Outpatient Encounter Medications as of 03/23/2024  Medication Sig   predniSONE (DELTASONE) 20 MG tablet Take 1 tablet (20 mg total) by mouth daily with breakfast for 5 days.   albuterol (VENTOLIN HFA) 108 (90 Base) MCG/ACT inhaler Inhale 2 puffs into the lungs every 6 (six) hours as needed for wheezing or shortness of breath. (Patient not taking: Reported on 03/23/2024)   busPIRone (BUSPAR) 15 MG tablet Take 1 tablet (15 mg total) by mouth at bedtime. (Patient not taking: Reported on 03/23/2024)    cloNIDine (CATAPRES) 0.1 MG tablet Take 1 tablet (0.1 mg total) by mouth 2 (two) times daily. (Patient not taking: Reported on 03/23/2024)   COVID-19 mRNA vaccine, Moderna, 100 MCG/0.5ML injection Inject into the muscle. (Patient not taking: Reported on 01/31/2022)   ferrous sulfate 325 (65 FE) MG tablet TAKE 1 TABLET BY MOUTH IN THE MORNING AND AT BEDTIME.   hydrOXYzine (ATARAX) 25 MG tablet Take 1 tablet (25 mg total) by mouth 3 (three) times daily as needed. (Patient not taking: Reported on 03/23/2024)   OLANZapine (ZYPREXA) 10 MG tablet Take 1 tablet (10 mg total) by mouth at bedtime. (Patient not taking: Reported on 03/23/2024)   OLANZapine (ZYPREXA) 5 MG tablet Take 1 tablet (5 mg total) by mouth daily in the morning. (Patient not taking: Reported on 03/23/2024)   ondansetron (ZOFRAN) 4 MG tablet Take 1 tablet (4 mg total) by mouth every 6 (six) hours. (Patient not taking: Reported on 03/23/2024)   Facility-Administered Encounter Medications as of 03/23/2024  Medication   ketorolac (TORADOL) 30 MG/ML injection 30 mg    Review of Systems  Review of Systems  Constitutional: Negative.   HENT: Negative.    Cardiovascular: Negative.   Gastrointestinal: Negative.   Allergic/Immunologic: Negative.   Neurological: Negative.   Psychiatric/Behavioral: Negative.       Objective:   BP 114/75   Pulse 61   Temp 98.1 F (36.7 C)   Wt 147 lb 12.8 oz (67 kg)   SpO2  97%   BMI 25.37 kg/m   Wt Readings from Last 5 Encounters:  03/23/24 147 lb 12.8 oz (67 kg)  03/09/24 149 lb 14.6 oz (68 kg)  09/07/22 149 lb 3.2 oz (67.7 kg)  06/26/22 151 lb (68.5 kg)  02/01/22 145 lb (65.8 kg)     Physical Exam Vitals and nursing note reviewed.  Constitutional:      General: She is not in acute distress.    Appearance: She is well-developed.  Cardiovascular:     Rate and Rhythm: Normal rate and regular rhythm.  Pulmonary:     Effort: Pulmonary effort is normal.     Breath sounds: Normal breath sounds.   Neurological:     Mental Status: She is alert and oriented to person, place, and time.       Assessment & Plan:   Right foot pain -     predniSONE; Take 1 tablet (20 mg total) by mouth daily with breakfast for 5 days.  Dispense: 5 tablet; Refill: 0 -     Ketorolac Tromethamine -     Ambulatory referral to Podiatry     Return in about 3 months (around 06/22/2024).   Ivonne Andrew, NP 03/23/2024

## 2024-03-23 NOTE — Patient Instructions (Signed)
 1. Right foot pain (Primary)  - predniSONE (DELTASONE) 20 MG tablet; Take 1 tablet (20 mg total) by mouth daily with breakfast for 5 days.  Dispense: 5 tablet; Refill: 0 - ketorolac (TORADOL) 30 MG/ML injection 30 mg - Ambulatory referral to Podiatry

## 2024-05-21 ENCOUNTER — Ambulatory Visit: Payer: Self-pay | Admitting: Nurse Practitioner

## 2024-05-22 ENCOUNTER — Other Ambulatory Visit: Payer: Self-pay

## 2024-05-22 ENCOUNTER — Ambulatory Visit (INDEPENDENT_AMBULATORY_CARE_PROVIDER_SITE_OTHER): Admitting: Nurse Practitioner

## 2024-05-22 ENCOUNTER — Encounter: Payer: Self-pay | Admitting: Nurse Practitioner

## 2024-05-22 VITALS — BP 106/74 | HR 61 | Resp 16 | Ht 64.0 in | Wt 148.4 lb

## 2024-05-22 DIAGNOSIS — M5441 Lumbago with sciatica, right side: Secondary | ICD-10-CM

## 2024-05-22 MED ORDER — KETOROLAC TROMETHAMINE 30 MG/ML IJ SOLN
30.0000 mg | Freq: Once | INTRAMUSCULAR | Status: AC
  Administered 2024-05-22: 30 mg via INTRAMUSCULAR

## 2024-05-22 MED ORDER — BIOTENE DRY MOUTH MOIST SPRAY MT SOLN
2.0000 | Freq: Every evening | OROMUCOSAL | 2 refills | Status: AC | PRN
  Filled 2024-05-22: qty 44.3, fill #0

## 2024-05-22 MED ORDER — PREDNISONE 20 MG PO TABS
20.0000 mg | ORAL_TABLET | Freq: Every day | ORAL | 0 refills | Status: AC
Start: 2024-05-22 — End: 2024-05-27
  Filled 2024-05-22: qty 5, 5d supply, fill #0

## 2024-05-22 NOTE — Patient Instructions (Signed)
 1. Acute back pain with sciatica, right (Primary)  - Artificial Saliva (BIOTENE DRY MOUTH MOIST SPRAY) SOLN; Use as directed 2 sprays in the mouth or throat at bedtime as needed.  Dispense: 44.3 mL; Refill: 2 - predniSONE  (DELTASONE ) 20 MG tablet; Take 1 tablet (20 mg total) by mouth daily with breakfast for 5 days.  Dispense: 5 tablet; Refill: 0 - ketorolac  (TORADOL ) 30 MG/ML injection 30 mg - AMB referral to orthopedics  Citica Sciatica  La citica es el dolor, debilidad, hormigueo o prdida de la sensibilidad (adormecimiento) a lo largo del nervio citico. El nervio citico comienza en la parte inferior de la espalda y desciende por la parte posterior de cada pierna. La citica suele afectar un lado del cuerpo. Suele desaparecer por s sola o con tratamiento. A veces, la citica puede volver a manifestarse. Cules son las causas? Esta afeccin se produce cuando el nervio citico se comprime o se ejerce presin sobre l. Las causas de esto pueden ser las siguientes: Un disco que sobresale demasiado entre los huesos de la columna vertebral (hernia de disco). Cambios en los discos vertebrales debido al envejecimiento. Una afeccin en un msculo de las nalgas. Un crecimiento seo adicional cerca del nervio citico. Una rotura (fractura) de la zona que est entre los huesos de la cadera (pelvis). Embarazo. Tumor. Esto es poco frecuente. Qu incrementa el riesgo? Es ms probable que tengan esta afeccin las personas que: Software engineer deportes que ponen presin o tensin sobre la columna vertebral. Tienen poca fuerza y facilidad de movimiento (flexibilidad). Han tenido una lesin en la espalda o una ciruga de espalda. Permanecen sentadas durante largos perodos. Realizan actividades que implican agacharse o levantar objetos una y [place]. Tienen mucho sobrepeso (son obesas). Cules son los signos o sntomas? Los sntomas pueden variar de leves a muy graves. Pueden incluir los  siguientes: Cualquiera de los siguientes problemas en la parte inferior de la espalda, piernas, cadera o nalgas: Hormigueo leve, prdida de la sensibilidad o dolor sordo. Sensacin de ardor. Dolor agudo. Prdida de la sensibilidad en la parte posterior de la pantorrilla o la planta del pie. Debilidad en las piernas. Dolor muy intenso en la espalda que dificulta el movimiento. Estos sntomas pueden empeorar al toser, Engineering geologist o rer. Tambin pueden empeorar al sentarse o estar de pie durante largos perodos. Cmo se trata? A menudo, esta afeccin mejora sin tratamiento. Sin embargo, el tratamiento puede incluir: Multimedia programmer de [place] fsica o reducirla cuando siente dolor. Hacer ejercicio, incluido el fortalecimiento y Management consultant. Aplicar hielo o calor sobre la zona afectada. Administrar inyecciones de medicamentos para reducir Chief Technology Officer y la hinchazn, o para relajar los msculos. Ciruga. Siga estas instrucciones en su casa: Medicamentos Use los medicamentos de venta libre y los recetados solamente como se lo haya indicado el mdico. Pregunte al mdico si debe evitar conducir o utilizar mquinas mientras toma los medicamentos. Control del dolor     Si se lo indican, aplique hielo en la zona afectada. Para hacer esto: Ponga el hielo en una bolsa plstica. Coloque una toalla entre la piel y la bolsa. Aplique el hielo durante 20 minutos, 2 a 3 veces por da. Si la piel se le pone de color rojo brillante, quite el hielo de inmediato para evitar daos en la piel. El riesgo de dao en la piel es mayor si no puede sentir dolor, calor o fro. Si se lo indican, aplique calor en la zona afectada. Hgalo con la frecuencia que le haya indicado  el mdico. Use la fuente de calor que el mdico le indique, por ejemplo, una compresa de calor hmedo o una almohadilla trmica. Coloque una toalla entre la piel y la fuente de calor. Aplique calor durante 20 a 30 minutos. Si la piel se le pone de  color rojo brillante, retire Company secretary de inmediato para evitar quemaduras. El riesgo de quemaduras es mayor si no puede sentir el dolor, el calor o el fro. Actividad  Retome sus actividades normales cuando el mdico le diga que es seguro. Evite las [employer] sntomas. Descanse por breves perodos Administrator. Cuando descanse durante perodos ms largos, haga alguna actividad fsica o un estiramiento entre los perodos de descanso. Evite estar sentado durante largos perodos sin moverse. Levntese y [place] al menos una vez cada hora. Haga suavemente los ejercicios que le indique el mdico. No levante ningn objeto que pese ms de 10 libras (4.5 kg). Aunque no tenga sntomas, evite levantar objetos pesados. Evite levantar objetos pesados de forma repetida. Al levantar objetos, hgalo siempre de una forma que sea segura para su cuerpo. Para esto, debe hacer lo siguiente: Flexione las rodillas. Mantenga el objeto cerca del cuerpo. No gire el cuerpo. Instrucciones generales Mantenga un peso saludable. Use calzado cmodo, que le d soporte al pie. Evite usar tacones. Evite dormir sobre un colchn que sea demasiado blando o demasiado duro. Es posible que sienta menos dolor si duerme en un colchn con apoyo suficientemente firme para la espalda. Comunquese con un mdico si: El dolor no se alivia con los [employer]. El dolor no mejora. El dolor [place]. Los sntomas duran ms de 4 semanas. Pierde peso sin proponrselo. Solicite ayuda de inmediato si: No puede controlar el pis (miccin) ni la evacuacin de la materia fecal (deposiciones). Tiene debilidad en alguna de estas zonas, y la debilidad empeora: La parte inferior de la espalda. La zona que se encuentra entre los [employer] caderas. Las nalgas. Las piernas. Siente irritacin o inflamacin en la espalda. Tiene sensacin de ardor al [employer]. Resumen La citica es el dolor, debilidad, hormigueo o prdida de  la sensibilidad (adormecimiento) a lo largo del nervio citico. Esto puede incluir la parte inferior de la espala, las piernas, las caderas y los glteos. Esta afeccin se produce cuando el nervio citico se comprime o se ejerce presin sobre l. El tratamiento a menudo incluye reposo, ejercicio, medicamentos y Contractor hielo o calor en la zona afectada. Esta informacin no tiene Theme park manager el consejo del mdico. Asegrese de hacerle al mdico cualquier pregunta que tenga. Document Revised: 04/10/2022 Document Reviewed: 04/10/2022 Elsevier Patient Education  2024 [employer].

## 2024-05-22 NOTE — Progress Notes (Signed)
 Subjective   Patient ID: Gina Burton, female    DOB: 12/22/1970, 53 y.o.   MRN: [id number]  Chief Complaint  Patient presents with   Medical Management of Chronic Issues    Referring provider: Jerrlyn Morel, NP  Gina Burton is a 53 y.o. female with Past Medical History: No date: Asthma No date: Bipolar disorder, current episode hypomanic [facility])  HPI  Patient presents today for an acute visit.  She states that she has been having right lower back pain that is radiating down to her leg.  She states that this been an issue for the last few months.  She has missed several days of work and does need paperwork completed for her job.  She states that the pain radiating down into her leg and foot is a burning type nerve pain.  We discussed that most likely this is sciatica.  Will place referral for orthopedics.  We will trial Toradol  and prednisone  to start tomorrow. Denies f/c/s, n/v/d, hemoptysis, PND, leg swelling Denies chest pain or edema    Allergies  Allergen Reactions   Seroquel  [Quetiapine ]     Severe akathisia    Immunization History  Administered Date(s) Administered   Influenza,inj,Quad PF,6+ Mos 10/26/2021   Moderna SARS-COV2 Booster Vaccination 04/19/2021   Moderna Sars-Covid-2 Vaccination 08/31/2020, 09/28/2020   PNEUMOCOCCAL CONJUGATE-20 10/26/2021   Tdap 02/15/2016, 11/14/2018    Tobacco History: Social History   Tobacco Use  Smoking Status Never  Smokeless Tobacco Never   Counseling given: Not Answered   Outpatient Encounter Medications as of 05/22/2024  Medication Sig   Artificial Saliva (BIOTENE DRY MOUTH MOIST SPRAY) SOLN Use as directed 2 sprays in the mouth or throat at bedtime as needed.   predniSONE  (DELTASONE ) 20 MG tablet Take 1 tablet (20 mg total) by mouth daily with breakfast for 5 days.   albuterol  (VENTOLIN  HFA) 108 (90 Base) MCG/ACT inhaler Inhale 2 puffs into the lungs every 6 (six) hours as needed for  wheezing or shortness of breath. (Patient not taking: Reported on 03/23/2024)   busPIRone  (BUSPAR ) 15 MG tablet Take 1 tablet (15 mg total) by mouth at bedtime. (Patient not taking: Reported on 03/23/2024)   cloNIDine  (CATAPRES ) 0.1 MG tablet Take 1 tablet (0.1 mg total) by mouth 2 (two) times daily. (Patient not taking: Reported on 03/23/2024)   COVID-19 mRNA vaccine, Moderna, 100 MCG/0.5ML injection Inject into the muscle. (Patient not taking: Reported on 01/31/2022)   ferrous sulfate  325 (65 FE) MG tablet TAKE 1 TABLET BY MOUTH IN THE MORNING AND AT BEDTIME.   hydrOXYzine  (ATARAX ) 25 MG tablet Take 1 tablet (25 mg total) by mouth 3 (three) times daily as needed. (Patient not taking: Reported on 03/23/2024)   OLANZapine  (ZYPREXA ) 10 MG tablet Take 1 tablet (10 mg total) by mouth at bedtime. (Patient not taking: Reported on 03/23/2024)   OLANZapine  (ZYPREXA ) 5 MG tablet Take 1 tablet (5 mg total) by mouth daily in the morning. (Patient not taking: Reported on 03/23/2024)   ondansetron  (ZOFRAN ) 4 MG tablet Take 1 tablet (4 mg total) by mouth every 6 (six) hours. (Patient not taking: Reported on 03/23/2024)   [EXPIRED] ketorolac  (TORADOL ) 30 MG/ML injection 30 mg    No facility-administered encounter medications on file as of 05/22/2024.    Review of Systems  Review of Systems  Constitutional: Negative.   HENT: Negative.    Cardiovascular: Negative.   Gastrointestinal: Negative.   Allergic/Immunologic: Negative.   Neurological: Negative.  Psychiatric/Behavioral: Negative.       Objective:   BP 106/74   Pulse 61   Resp 16   Ht 5\' 4"  (1.626 m)   Wt 148 lb 6.4 oz (67.3 kg)   SpO2 98%   BMI 25.47 kg/m   Wt Readings from Last 5 Encounters:  05/22/24 148 lb 6.4 oz (67.3 kg)  03/23/24 147 lb 12.8 oz (67 kg)  03/09/24 149 lb 14.6 oz (68 kg)  09/07/22 149 lb 3.2 oz (67.7 kg)  06/26/22 151 lb (68.5 kg)     Physical Exam Vitals and nursing note reviewed.  Constitutional:      General: She is not  in acute distress.    Appearance: She is well-developed.  Cardiovascular:     Rate and Rhythm: Normal rate and regular rhythm.  Pulmonary:     Effort: Pulmonary effort is normal.     Breath sounds: Normal breath sounds.  Neurological:     Mental Status: She is alert and oriented to person, place, and time.       Assessment & Plan:   Acute back pain with sciatica, right -     Biotene Dry Mouth Moist Spray; Use as directed 2 sprays in the mouth or throat at bedtime as needed.  Dispense: 44.3 mL; Refill: 2 -     predniSONE ; Take 1 tablet (20 mg total) by mouth daily with breakfast for 5 days.  Dispense: 5 tablet; Refill: 0 -     Ketorolac  Tromethamine  -     Ambulatory referral to Orthopedics     Return in about 3 months (around 08/22/2024).   Jerrlyn Morel, NP 05/22/2024

## 2024-05-22 NOTE — Progress Notes (Signed)
 Aided by  Jamie #[id number]

## 2024-06-04 ENCOUNTER — Encounter: Payer: Self-pay | Admitting: Physician Assistant

## 2024-06-04 ENCOUNTER — Other Ambulatory Visit: Payer: Self-pay

## 2024-06-04 ENCOUNTER — Other Ambulatory Visit (INDEPENDENT_AMBULATORY_CARE_PROVIDER_SITE_OTHER)

## 2024-06-04 ENCOUNTER — Ambulatory Visit: Payer: Self-pay | Admitting: Physician Assistant

## 2024-06-04 DIAGNOSIS — G8929 Other chronic pain: Secondary | ICD-10-CM | POA: Diagnosis not present

## 2024-06-04 DIAGNOSIS — M5441 Lumbago with sciatica, right side: Secondary | ICD-10-CM

## 2024-06-04 DIAGNOSIS — M545 Low back pain, unspecified: Secondary | ICD-10-CM | POA: Insufficient documentation

## 2024-06-04 DIAGNOSIS — M5442 Lumbago with sciatica, left side: Secondary | ICD-10-CM

## 2024-06-04 MED ORDER — MELOXICAM 15 MG PO TABS
15.0000 mg | ORAL_TABLET | Freq: Every day | ORAL | 0 refills | Status: DC
  Filled 2024-06-04: qty 30, 30d supply, fill #0

## 2024-06-04 MED ORDER — METHOCARBAMOL 500 MG PO TABS
500.0000 mg | ORAL_TABLET | Freq: Four times a day (QID) | ORAL | 0 refills | Status: DC | PRN
  Filled 2024-06-04: qty 30, 8d supply, fill #0

## 2024-06-04 NOTE — Addendum Note (Signed)
 Addended by: Freda Jacobson on: 06/04/2024 10:40 AM   Modules accepted: Orders

## 2024-06-04 NOTE — Progress Notes (Signed)
 Office Visit Note   Patient: Gina Burton           Date of Birth: 01/16/1971           MRN: [id number] Visit Date: 06/04/2024              Requested by: Jerrlyn Morel, NP [phone] [place],  [place] [address] PCP: Jerrlyn Morel, NP   Assessment & Plan: Visit Diagnoses:  1. Chronic bilateral low back pain with sciatica, sciatica laterality unspecified     Plan: Patient was seen with the assistance of an interpreter today.  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  Last 2 weeks and seems to have gotten worse denies any loss of bowel or bladder control.  Pain is focused over the right buttock radiating down the right leg and she has some associated paresthesias.  Her strength is 5 out of 5 with extension and flexion of her lower extremities she does have a positive straight leg raise.  She did not tolerate steroids.  I would place her on a regular dose of meloxicam .  Made it clear she is not to take this with other anti-inflammatories.  Will refer her to physical therapy..  Can follow-up with Megan in a month if she has any escalating symptoms should contact me immediately all questions were answered  Follow-Up Instructions: Return in about 1 month (around 07/04/2024).   Orders:  Orders Placed This Encounter  Procedures   XR Lumbar Spine 2-3 Views   Ambulatory referral to Physical Therapy   No orders of the defined types were placed in this encounter.     Procedures: No procedures performed   Clinical Data: No additional findings.   Subjective: No chief complaint on file.   HPI Patient is a 53 year old woman with a 86-month history of low back pain.  This got worse in the last 2 weeks.  Denies any loss of bowel or bladder control.  She has had 2 rounds of prednisone  and had difficulty tolerating it.  The pain in the lower back radiates down  to the foot.  She does have episodes of the toes going numb  Review of Systems  All other systems reviewed and are negative.    Objective: Vital Signs: There were no vitals taken for this visit.  Physical Exam Constitutional:      Appearance: Normal appearance.   Skin:    General: Skin is warm and dry.   Neurological:     General: No focal deficit present.     Mental Status: She is alert and oriented to person, place, and time.   Psychiatric:        Mood and Affect: Mood normal.        Behavior: Behavior normal.     Ortho Exam Examination of her lower back no redness no step offs no crepitus.  She has increased pain in the lower back with forward flexion and extension of her back.  No pain with manipulation of her hip.  Strength wise she has excellent strength with resisted flexion and extension of her legs.  Dorsiflexion and plantarflexion of her ankles.  Some altered sensation intermittently into the top of the foot.  She does have a positive straight leg raise on the right which recreates some pain in her right buttock Specialty Comments:  No  specialty comments available.  Imaging: XR Lumbar Spine 2-3 Views Result Date: 06/04/2024 2 view radiographs of the lumbar spine demonstrate overall well-maintained alignment some degenerative changes at L4-5 and L5-S1 and facet arthropathy    PMFS History: Patient Active Problem List   Diagnosis Date Noted   Low back pain 06/04/2024   Pseudocyesis 10/08/2022   Fibroid uterus 09/07/2022   DUB (dysfunctional uterine bleeding) 09/07/2022   Grief at loss of child 07/24/2022   Adjustment disorder with depressed mood 02/01/2022   Menorrhagia with irregular cycle 06/13/2021   GAD (generalized anxiety disorder) 03/06/2021   Rib pain 03/06/2021   Psychophysiological insomnia 03/06/2021   Closed fracture of one rib of left side 03/06/2021   Vitamin D  deficiency 03/06/2021   Absolute anemia 03/06/2021   Hypokalemia 03/06/2021    Major depression, chronic 09/15/2020   Bipolar disorder, current episode hypomanic (HCC)    Asthma    Past Medical History:  Diagnosis Date   Asthma    Bipolar disorder, current episode hypomanic (HCC)     Family History  Problem Relation Age of Onset   Cancer Mother        Possibly Esophageal   Asthma Mother    COPD Mother        smoker   Bipolar disorder Mother        Possibly as well.  Not clear if diagnosed   Seizures Sister     Past Surgical History:  Procedure Laterality Date   arm surgery     CHOLECYSTECTOMY  1997   Social History   Occupational History   Not on file  Tobacco Use   Smoking status: Never   Smokeless tobacco: Never  Substance and Sexual Activity   Alcohol use: No   Drug use: No   Sexual activity: Not Currently

## 2024-06-05 ENCOUNTER — Other Ambulatory Visit: Payer: Self-pay

## 2024-06-05 MED ORDER — MELOXICAM 15 MG PO TABS
15.0000 mg | ORAL_TABLET | Freq: Every day | ORAL | 0 refills | Status: DC
  Filled 2024-06-05: qty 30, 30d supply, fill #0

## 2024-06-05 MED ORDER — MELOXICAM 15 MG PO TABS: 15.0000 mg | ORAL_TABLET | Freq: Every day | ORAL | 0 refills | Status: DC

## 2024-06-05 MED ORDER — METHOCARBAMOL 500 MG PO TABS: 500.0000 mg | ORAL_TABLET | Freq: Four times a day (QID) | ORAL | 0 refills | Status: DC | PRN

## 2024-06-05 NOTE — Addendum Note (Signed)
 Addended by: Freda Jacobson on: 06/05/2024 09:26 AM   Modules accepted: Orders

## 2024-06-05 NOTE — Addendum Note (Signed)
 Addended by: Freda Jacobson on: 06/05/2024 09:23 AM   Modules accepted: Orders

## 2024-06-22 ENCOUNTER — Ambulatory Visit: Admitting: Nurse Practitioner

## 2024-06-22 ENCOUNTER — Encounter: Payer: Self-pay | Admitting: Nurse Practitioner

## 2024-06-22 ENCOUNTER — Other Ambulatory Visit: Payer: Self-pay

## 2024-06-22 VITALS — BP 105/74 | HR 82 | Temp 98.2°F | Wt 147.8 lb

## 2024-06-22 DIAGNOSIS — R7301 Impaired fasting glucose: Secondary | ICD-10-CM

## 2024-06-22 DIAGNOSIS — M5441 Lumbago with sciatica, right side: Secondary | ICD-10-CM | POA: Diagnosis not present

## 2024-06-22 LAB — POCT GLYCOSYLATED HEMOGLOBIN (HGB A1C): Hemoglobin A1C: 5.4 % (ref 4.0–5.6)

## 2024-06-22 MED ORDER — METHOCARBAMOL 500 MG PO TABS
500.0000 mg | ORAL_TABLET | Freq: Four times a day (QID) | ORAL | 0 refills | Status: DC | PRN
  Filled 2024-06-22: qty 30, 8d supply, fill #0

## 2024-06-22 MED ORDER — KETOROLAC TROMETHAMINE 30 MG/ML IJ SOLN
30.0000 mg | Freq: Once | INTRAMUSCULAR | Status: AC
  Administered 2024-06-22: 30 mg via INTRAMUSCULAR

## 2024-06-22 MED ORDER — MELOXICAM 15 MG PO TABS
15.0000 mg | ORAL_TABLET | Freq: Every day | ORAL | 0 refills | Status: DC
  Filled 2024-06-22 – 2024-06-29 (×2): qty 30, 30d supply, fill #0

## 2024-06-22 NOTE — Patient Instructions (Signed)
 Acute Back Pain, Adult Acute back pain is sudden and usually short-lived. It is often caused by an injury to the muscles and tissues in the back. The injury may result from: A muscle, tendon, or ligament getting overstretched or torn. Ligaments are tissues that connect bones to each other. Lifting something improperly can cause a back strain. Wear and tear (degeneration) of the spinal disks. Spinal disks are circular tissue that provide cushioning between the bones of the spine (vertebrae). Twisting motions, such as while playing sports or doing yard work. A hit to the back. Arthritis. You may have a physical exam, lab tests, and imaging tests to find the cause of your pain. Acute back pain usually goes away with rest and home care. Follow these instructions at home: Managing pain, stiffness, and swelling Take over-the-counter and prescription medicines only as told by your health care provider. Treatment may include medicines for pain and inflammation that are taken by mouth or applied to the skin, or muscle relaxants. Your health care provider may recommend applying ice during the first 24-48 hours after your pain starts. To do this: Put ice in a plastic bag. Place a towel between your skin and the bag. Leave the ice on for 20 minutes, 2-3 times a day. Remove the ice if your skin turns bright red. This is very important. If you cannot feel pain, heat, or cold, you have a greater risk of damage to the area. If directed, apply heat to the affected area as often as told by your health care provider. Use the heat source that your health care provider recommends, such as a moist heat pack or a heating pad. Place a towel between your skin and the heat source. Leave the heat on for 20-30 minutes. Remove the heat if your skin turns bright red. This is especially important if you are unable to feel pain, heat, or cold. You have a greater risk of getting burned. Activity  Do not stay in bed. Staying in  bed for more than 1-2 days can delay your recovery. Sit up and stand up straight. Avoid leaning forward when you sit or hunching over when you stand. If you work at a desk, sit close to it so you do not need to lean over. Keep your chin tucked in. Keep your neck drawn back, and keep your elbows bent at a 90-degree angle (right angle). Sit high and close to the steering wheel when you drive. Add lower back (lumbar) support to your car seat, if needed. Take short walks on even surfaces as soon as you are able. Try to increase the length of time you walk each day. Do not sit, drive, or stand in one place for more than 30 minutes at a time. Sitting or standing for long periods of time can put stress on your back. Do not drive or use heavy machinery while taking prescription pain medicine. Use proper lifting techniques. When you bend and lift, use positions that put less stress on your back: [place] your knees. Keep the load close to your body. Avoid twisting. Exercise regularly as told by your health care provider. Exercising helps your back heal faster and helps prevent back injuries by keeping muscles strong and flexible. Work with a physical therapist to make a safe exercise program, as recommended by your health care provider. Do any exercises as told by your physical therapist. Lifestyle Maintain a healthy weight. Extra weight puts stress on your back and makes it difficult to have good  posture. Avoid activities or situations that make you feel anxious or stressed. Stress and anxiety increase muscle tension and can make back pain worse. Learn ways to manage anxiety and stress, such as through exercise. General instructions Sleep on a firm mattress in a comfortable position. Try lying on your side with your knees slightly bent. If you lie on your back, put a pillow under your knees. Keep your head and neck in a straight line with your spine (neutral position) when using electronic equipment like  smartphones or pads. To do this: Raise your smartphone or pad to look at it instead of bending your head or neck to look down. Put the smartphone or pad at the level of your face while looking at the screen. Follow your treatment plan as told by your health care provider. This may include: Cognitive or behavioral therapy. Acupuncture or massage therapy. Meditation or yoga. Contact a health care provider if: You have pain that is not relieved with rest or medicine. You have increasing pain going down into your legs or buttocks. Your pain does not improve after 2 weeks. You have pain at night. You lose weight without trying. You have a fever or chills. You develop nausea or vomiting. You develop abdominal pain. Get help right away if: You develop new bowel or bladder control problems. You have unusual weakness or numbness in your arms or legs. You feel faint. These symptoms may represent a serious problem that is an emergency. Do not wait to see if the symptoms will go away. Get medical help right away. Call your local emergency services (911 in the [place] Do not drive yourself to the hospital. Summary Acute back pain is sudden and usually short-lived. Use proper lifting techniques. When you bend and lift, use positions that put less stress on your back. Take over-the-counter and prescription medicines only as told by your health care provider, and apply heat or ice as told. This information is not intended to replace advice given to you by your health care provider. Make sure you discuss any questions you have with your health care provider. Document Revised: 02/24/2021 Document Reviewed: 02/24/2021 Elsevier Patient Education  2024 [employer].

## 2024-06-22 NOTE — Progress Notes (Signed)
 Subjective   Patient ID: Gina Burton Kristie Grass, female    DOB: Jul 05, 1971, 54 y.o.   MRN: [id number]  Chief Complaint  Patient presents with   Pain    Whole right side pain, medication refills needed    Referring provider: [name], NP  Mercer Evener Gina Burton is a 53 y.o. female with Past Medical History: No date: Asthma No date: Bipolar disorder, current episode hypomanic [facility])   HPI  Patient presents today for follow-up on back pain.  She has followed with orthopedics since her last visit here.  She does have an appointment this week with physical therapy and another follow-up with orthopedics this week.  Patient is still having ongoing pain.  She would like a Toradol  injection in office today.  She is concerned that she may have diabetes and would like to get checked.  Glucose level was impaired at last check.  A1c in office today was 5.4. Denies f/c/s, n/v/d, hemoptysis, PND, leg swelling Denies chest pain or edema        Allergies  Allergen Reactions   Seroquel  [Quetiapine ]     Severe akathisia    Immunization History  Administered Date(s) Administered   Influenza,inj,Quad PF,6+ Mos 10/26/2021   Moderna SARS-COV2 Booster Vaccination 04/19/2021   Moderna Sars-Covid-2 Vaccination 08/31/2020, 09/28/2020   PNEUMOCOCCAL CONJUGATE-20 10/26/2021   Tdap 02/15/2016, 11/14/2018    Tobacco History: Social History   Tobacco Use  Smoking Status Never  Smokeless Tobacco Never   Counseling given: Not Answered   Outpatient Encounter Medications as of 06/22/2024  Medication Sig   [DISCONTINUED] meloxicam  (MOBIC ) 15 MG tablet Take 1 tablet (15 mg total) by mouth daily.   [DISCONTINUED] methocarbamol  (ROBAXIN ) 500 MG tablet Take 1 tablet (500 mg total) by mouth every 6 (six) hours as needed for muscle spasms.   albuterol  (VENTOLIN  HFA) 108 (90 Base) MCG/ACT inhaler Inhale 2 puffs into the lungs every 6 (six) hours as needed for wheezing or shortness of  breath. (Patient not taking: Reported on 03/23/2024)   Artificial Saliva (BIOTENE DRY MOUTH MOIST SPRAY) SOLN Use as directed 2 sprays in the mouth or throat at bedtime as needed.   busPIRone  (BUSPAR ) 15 MG tablet Take 1 tablet (15 mg total) by mouth at bedtime. (Patient not taking: Reported on 03/23/2024)   cloNIDine  (CATAPRES ) 0.1 MG tablet Take 1 tablet (0.1 mg total) by mouth 2 (two) times daily. (Patient not taking: Reported on 03/23/2024)   COVID-19 mRNA vaccine, Moderna, 100 MCG/0.5ML injection Inject into the muscle. (Patient not taking: Reported on 01/31/2022)   ferrous sulfate  325 (65 FE) MG tablet TAKE 1 TABLET BY MOUTH IN THE MORNING AND AT BEDTIME.   hydrOXYzine  (ATARAX ) 25 MG tablet Take 1 tablet (25 mg total) by mouth 3 (three) times daily as needed. (Patient not taking: Reported on 03/23/2024)   meloxicam  (MOBIC ) 15 MG tablet Take 1 tablet (15 mg total) by mouth daily.   methocarbamol  (ROBAXIN ) 500 MG tablet Take 1 tablet (500 mg total) by mouth every 6 (six) hours as needed for muscle spasms.   OLANZapine  (ZYPREXA ) 10 MG tablet Take 1 tablet (10 mg total) by mouth at bedtime. (Patient not taking: Reported on 03/23/2024)   OLANZapine  (ZYPREXA ) 5 MG tablet Take 1 tablet (5 mg total) by mouth daily in the morning. (Patient not taking: Reported on 03/23/2024)   ondansetron  (ZOFRAN ) 4 MG tablet Take 1 tablet (4 mg total) by mouth every 6 (six) hours. (Patient not taking: Reported on 03/23/2024)   [  EXPIRED] ketorolac  (TORADOL ) 30 MG/ML injection 30 mg    No facility-administered encounter medications on file as of 06/22/2024.    Review of Systems  Review of Systems  Constitutional: Negative.   HENT: Negative.    Cardiovascular: Negative.   Gastrointestinal: Negative.   Allergic/Immunologic: Negative.   Neurological: Negative.   Psychiatric/Behavioral: Negative.       Objective:   BP 105/74   Pulse 82   Temp 98.2 F (36.8 C) (Oral)   Wt 147 lb 12.8 oz (67 kg)   SpO2 100%   BMI 25.37  kg/m   Wt Readings from Last 5 Encounters:  06/22/24 147 lb 12.8 oz (67 kg)  05/22/24 148 lb 6.4 oz (67.3 kg)  03/23/24 147 lb 12.8 oz (67 kg)  03/09/24 149 lb 14.6 oz (68 kg)  09/07/22 149 lb 3.2 oz (67.7 kg)     Physical Exam Vitals and nursing note reviewed.  Constitutional:      General: She is not in acute distress.    Appearance: She is well-developed.  Cardiovascular:     Rate and Rhythm: Normal rate and regular rhythm.  Pulmonary:     Effort: Pulmonary effort is normal.     Breath sounds: Normal breath sounds.  Neurological:     Mental Status: She is alert and oriented to person, place, and time.       Assessment & Plan:   Acute back pain with sciatica, right -     Ketorolac  Tromethamine   Impaired fasting glucose -     POCT glycosylated hemoglobin (Hb A1C)  Other orders -     Meloxicam ; Take 1 tablet (15 mg total) by mouth daily.  Dispense: 30 tablet; Refill: 0 -     Methocarbamol ; Take 1 tablet (500 mg total) by mouth every 6 (six) hours as needed for muscle spasms.  Dispense: 30 tablet; Refill: 0     Return in about 3 months (around 09/22/2024).   Bascom GORMAN Borer, NP 06/22/2024

## 2024-06-23 NOTE — Therapy (Incomplete)
 OUTPATIENT PHYSICAL THERAPY THORACOLUMBAR EVALUATION   Patient Name: Gina Burton MRN: [id number] DOB:[birth year], 53 y.o., female Today's Date: 06/23/2024  END OF SESSION:   Past Medical History:  Diagnosis Date   Asthma    Bipolar disorder, current episode hypomanic [facility])    Past Surgical History:  Procedure Laterality Date   arm surgery     CHOLECYSTECTOMY  1997   Patient Active Problem List   Diagnosis Date Noted   Low back pain 06/04/2024   Pseudocyesis 10/08/2022   Fibroid uterus 09/07/2022   DUB (dysfunctional uterine bleeding) 09/07/2022   Grief at loss of child 07/24/2022   Adjustment disorder with depressed mood 02/01/2022   Menorrhagia with irregular cycle 06/13/2021   GAD (generalized anxiety disorder) 03/06/2021   Rib pain 03/06/2021   Psychophysiological insomnia 03/06/2021   Closed fracture of one rib of left side 03/06/2021   Vitamin D  deficiency 03/06/2021   Absolute anemia 03/06/2021   Hypokalemia 03/06/2021   Major depression, chronic 09/15/2020   Bipolar disorder, current episode hypomanic (HCC)    Asthma     PCP: [name], NP  REFERRING PROVIDER: Persons, [name], [place]  REFERRING DIAG: G89.29,M54.41,M54.42 (ICD-10-CM) - Chronic bilateral low back pain with sciatica, sciatica laterality unspecified  Rationale for Evaluation and Treatment: Rehabilitation  THERAPY DIAG:  No diagnosis found.  ONSET DATE: ***  SUBJECTIVE:                                                                                                                                                                                           SUBJECTIVE STATEMENT: ***  PERTINENT HISTORY:  asthma, bipolar disorder (hypomanic), GAD, insomnia  PAIN:  Are you having pain: *** Location/description: *** Best-worst over past week: ***  - aggravating factors: *** - Easing factors: ***    PRECAUTIONS: None  RED FLAGS: {PT Red Flags:29287}   WEIGHT  BEARING RESTRICTIONS: No  FALLS:  Has patient fallen in last 6 months? {fallsyesno:27318}  LIVING ENVIRONMENT: Lives with: {[facility] lives with:25569::lives with their family} Lives in: {Lives in:25570} Stairs: {opstairs:27293} Has following equipment at home: {Assistive devices:23999}  OCCUPATION: ***  PLOF: {PLOF:24004}  PATIENT GOALS: ***  NEXT MD VISIT: ***  OBJECTIVE:  Note: Objective measures were completed at Evaluation unless otherwise noted.  DIAGNOSTIC FINDINGS:  June 2025 Lumbar XR: 2 view radiographs of the lumbar spine demonstrate overall well-maintained  alignment some degenerative changes at L4-5 and L5-S1 and facet  arthropathy   PATIENT SURVEYS:  {rehab surveys:24030}  COGNITION: Overall cognitive status: {cognition:24006}     SENSATION: {sensation:27233}  LUMBAR ROM:   AROM eval  Flexion  Extension   Right lateral flexion   Left lateral flexion   Right rotation   Left rotation    (Blank rows = not tested) (Key: WFL = within functional limits not formally assessed, * = concordant pain, s = stiffness/stretching sensation, NT = not tested) Comment:   LOWER EXTREMITY ROM:     {AROM/PROM:27142}  Right eval Left eval  Hip flexion    Hip extension    Hip internal rotation    Hip external rotation    Knee extension    Knee flexion    (Blank rows = not tested) (Key: WFL = within functional limits not formally assessed, * = concordant pain, s = stiffness/stretching sensation, NT = not tested)  Comments:    LOWER EXTREMITY MMT:    MMT Right eval Left eval  Hip flexion    Hip abduction (modified sitting)    Hip internal rotation    Hip external rotation    Knee flexion    Knee extension    Ankle dorsiflexion     (Blank rows = not tested) (Key: WFL = within functional limits not formally assessed, * = concordant pain, s = stiffness/stretching sensation, NT = not tested)  Comments:    LUMBAR SPECIAL TESTS:   Slump test:   R:  ***   L: ***   FUNCTIONAL TESTS:  5xSTS/30secSTS: *** TUG: ***   GAIT: Distance walked: within clinic Assistive device utilized: {Assistive devices:23999} Level of assistance: {Levels of assistance:24026} Comments: ***   TREATMENT DATE:  [facility] Adult PT Treatment:                                                DATE: 06/24/24 Therapeutic Exercise: *** Manual Therapy: *** Neuromuscular re-ed: *** Therapeutic Activity: *** Modalities: *** Self Care: ***                                                                                                                                  PATIENT EDUCATION:  Education details: Pt education on PT impairments, prognosis, and POC. Informed consent. Rationale for interventions, safe/appropriate HEP performance Person educated: Patient Education method: Explanation, Demonstration, Tactile cues, Verbal cues Education comprehension: verbalized understanding, returned demonstration, verbal cues required, tactile cues required, and needs further education    HOME EXERCISE PROGRAM: ***  ASSESSMENT:  CLINICAL IMPRESSION: Patient is a 53 y.o. woman who was seen today for physical therapy evaluation and treatment for back pain. ***   OBJECTIVE IMPAIRMENTS: {opptimpairments:25111}.   ACTIVITY LIMITATIONS: {activitylimitations:27494}  PARTICIPATION LIMITATIONS: {participationrestrictions:25113}  PERSONAL FACTORS: Time since onset of injury/illness/exacerbation and 3+ comorbidities: asthma, bipolar disorder (hypomanic), GAD, insomnia are also affecting patient's functional outcome.   REHAB POTENTIAL: {rehabpotential:25112}  CLINICAL DECISION MAKING: {clinical decision making:25114}  EVALUATION COMPLEXITY: {Evaluation complexity:25115}   GOALS: Goals reviewed with  patient? {yes/no:20286}  SHORT TERM GOALS: Target date: ***  Pt will demonstrate appropriate understanding and performance of initially prescribed HEP in order to facilitate  improved independence with management of symptoms.  Baseline: HEP ***  Goal status: INITIAL   2. Pt will report at least 25% improvement in overall pain levels over past week in order to facilitate improved tolerance to typical daily activities.   Baseline: ***  Goal status: INITIAL    LONG TERM GOALS: Target date: *** Pt will improve at least 20% on ODI in order to demonstrate improved perception of functional status due to symptoms.  Baseline: *** Goal status: INITIAL  2.  Pt will demonstrate *** lumbar AROM in order to demonstrate improved tolerance to functional movement patterns.   Baseline: *** Goal status: INITIAL  3.  Pt will demonstrate hip MMT of *** in order to demonstrate improved strength for functional movements.  Baseline: *** Goal status: INITIAL  4. Pt will perform 5xSTS in <*** sec in order to demonstrate reduced fall risk and improved functional independence. (MCID of 2.3sec)  Baseline: ***  Goal status: INITIAL   PLAN:  PT FREQUENCY: {rehab frequency:25116}  PT DURATION: {rehab duration:25117}  PLANNED INTERVENTIONS: {rehab planned interventions:25118::97110-Therapeutic exercises,97530- Therapeutic [phone]- Neuromuscular re-education,97535- Self Rjmz,02859- Manual therapy}.  PLAN FOR NEXT SESSION: Review/update HEP PRN. Work on [employer] exercises as appropriate with emphasis on ***. Symptom modification strategies as indicated/appropriate.    Alm DELENA Jenny PT, DPT 06/23/2024 3:46 PM

## 2024-06-24 ENCOUNTER — Ambulatory Visit: Attending: Physician Assistant

## 2024-06-24 ENCOUNTER — Other Ambulatory Visit: Payer: Self-pay

## 2024-06-24 DIAGNOSIS — M5441 Lumbago with sciatica, right side: Secondary | ICD-10-CM | POA: Diagnosis not present

## 2024-06-24 DIAGNOSIS — M5442 Lumbago with sciatica, left side: Secondary | ICD-10-CM | POA: Insufficient documentation

## 2024-06-24 DIAGNOSIS — G8929 Other chronic pain: Secondary | ICD-10-CM | POA: Insufficient documentation

## 2024-06-24 DIAGNOSIS — M5459 Other low back pain: Secondary | ICD-10-CM | POA: Diagnosis not present

## 2024-06-24 DIAGNOSIS — M6281 Muscle weakness (generalized): Secondary | ICD-10-CM | POA: Insufficient documentation

## 2024-06-24 DIAGNOSIS — R2689 Other abnormalities of gait and mobility: Secondary | ICD-10-CM | POA: Insufficient documentation

## 2024-06-24 NOTE — Therapy (Incomplete)
 OUTPATIENT PHYSICAL THERAPY THORACOLUMBAR EVALUATION   Patient Name: Gina Burton MRN: [id number] DOB:1971/07/22, 53 y.o., female Today's Date: 06/24/2024  END OF SESSION:   Past Medical History:  Diagnosis Date   Asthma    Bipolar disorder, current episode hypomanic [facility])    Past Surgical History:  Procedure Laterality Date   arm surgery     CHOLECYSTECTOMY  1997   Patient Active Problem List   Diagnosis Date Noted   Low back pain 06/04/2024   Pseudocyesis 10/08/2022   Fibroid uterus 09/07/2022   DUB (dysfunctional uterine bleeding) 09/07/2022   Grief at loss of child 07/24/2022   Adjustment disorder with depressed mood 02/01/2022   Menorrhagia with irregular cycle 06/13/2021   GAD (generalized anxiety disorder) 03/06/2021   Rib pain 03/06/2021   Psychophysiological insomnia 03/06/2021   Closed fracture of one rib of left side 03/06/2021   Vitamin D  deficiency 03/06/2021   Absolute anemia 03/06/2021   Hypokalemia 03/06/2021   Major depression, chronic 09/15/2020   Bipolar disorder, current episode hypomanic (HCC)    Asthma     PCP: [name], NP  REFERRING PROVIDER: Persons, [name], [place]  REFERRING DIAG: G89.29,M54.41,M54.42 (ICD-10-CM) - Chronic bilateral low back pain with sciatica, sciatica laterality unspecified  Rationale for Evaluation and Treatment: Rehabilitation  THERAPY DIAG:  No diagnosis found.  ONSET DATE: ***  SUBJECTIVE:                                                                                                                                                                                           SUBJECTIVE STATEMENT: ***  PERTINENT HISTORY:  asthma, bipolar disorder (hypomanic), GAD, insomnia  PAIN:  Are you having pain?  Yes: NPRS scale: 10/10 Worst: 10/10 Pain location: lower back  Pain description: *** Aggravating factors: *** Relieving factors: ***   PRECAUTIONS: None  RED  FLAGS: None   WEIGHT BEARING RESTRICTIONS: No  FALLS:  Has patient fallen in last 6 months? No  LIVING ENVIRONMENT: Lives with: lives with their family Lives in: House/apartment  OCCUPATION: ***  PLOF: {PLOF:24004}  PATIENT GOALS: ***  NEXT MD VISIT: ***  OBJECTIVE:  Note: Objective measures were completed at Evaluation unless otherwise noted.  DIAGNOSTIC FINDINGS:  June 2025 Lumbar XR: 2 view radiographs of the lumbar spine demonstrate overall well-maintained  alignment some degenerative changes at L4-5 and L5-S1 and facet  arthropathy   PATIENT SURVEYS:  {rehab surveys:24030}  COGNITION: Overall cognitive status: {cognition:24006}     SENSATION: {sensation:27233}  LUMBAR ROM:   AROM eval  Flexion   Extension   Right lateral flexion   Left lateral  flexion   Right rotation   Left rotation    (Blank rows = not tested) (Key: WFL = within functional limits not formally assessed, * = concordant pain, s = stiffness/stretching sensation, NT = not tested) Comment:   LOWER EXTREMITY ROM:     {AROM/PROM:27142}  Right eval Left eval  Hip flexion    Hip extension    Hip internal rotation    Hip external rotation    Knee extension    Knee flexion    (Blank rows = not tested) (Key: WFL = within functional limits not formally assessed, * = concordant pain, s = stiffness/stretching sensation, NT = not tested)  Comments:    LOWER EXTREMITY MMT:    MMT Right eval Left eval  Hip flexion    Hip abduction (modified sitting)    Hip internal rotation    Hip external rotation    Knee flexion    Knee extension    Ankle dorsiflexion     (Blank rows = not tested) (Key: WFL = within functional limits not formally assessed, * = concordant pain, s = stiffness/stretching sensation, NT = not tested)  Comments:    LUMBAR SPECIAL TESTS:   Slump test:   R: ***   L: ***   FUNCTIONAL TESTS:  5xSTS/30secSTS: *** TUG: ***   GAIT: Distance walked: within  clinic Assistive device utilized: {Assistive devices:23999} Level of assistance: {Levels of assistance:24026} Comments: ***   TREATMENT DATE:  [facility] Adult PT Treatment:                                                DATE: 06/24/24 Therapeutic Exercise: *** Manual Therapy: *** Neuromuscular re-ed: *** Therapeutic Activity: *** Modalities: *** Self Care: ***                                                                                                                                  PATIENT EDUCATION:  Education details: Pt education on PT impairments, prognosis, and POC. Informed consent. Rationale for interventions, safe/appropriate HEP performance Person educated: Patient Education method: Explanation, Demonstration, Tactile cues, Verbal cues Education comprehension: verbalized understanding, returned demonstration, verbal cues required, tactile cues required, and needs further education    HOME EXERCISE PROGRAM: Access Code: [email or web address] URL: [email or web address] Date: 06/24/2024 Prepared by: Alm Kingdom  Exercises - Supine Quad Set  - 1 x daily - 7 x weekly - 2 sets - 10 reps - 5 sec hold - Active Straight Leg Raise with Quad Set  - 1 x daily - 7 x weekly - 3 sets - 5 reps - Supine Heel Slide with Strap  - 1 x daily - 7 x weekly - 2 sets - 10 reps - 5 sec hold - Hooklying Clamshell with Resistance  - 1 x daily - 7  x weekly - 3 sets - 10 reps - blue band hold - Seated Hamstring Stretch  - 1 x daily - 7 x weekly - 2 sets - 30 sec hold - Modified Thomas Stretch  - 1 x daily - 7 x weekly - 1 sets - 3 reps - 30 hold - Sit to stand with control  - 1 x daily - 7 x weekly - 1-2 sets - 10 reps - Static Prone on Elbows  - 1 x daily - 7 x weekly - 2 min hold - Prone Press Up  - 1 x daily - 7 x weekly - 2 sets - 10 reps - Supine Bridge  - 1 x daily - 7 x weekly - 2 sets - 10 reps  ASSESSMENT:  CLINICAL IMPRESSION: Patient is a 53 y.o. woman who was seen today for  physical therapy evaluation and treatment for back pain. ***   OBJECTIVE IMPAIRMENTS: {opptimpairments:25111}.   ACTIVITY LIMITATIONS: {activitylimitations:27494}  PARTICIPATION LIMITATIONS: {participationrestrictions:25113}  PERSONAL FACTORS: Time since onset of injury/illness/exacerbation and 3+ comorbidities: asthma, bipolar disorder (hypomanic), GAD, insomnia are also affecting patient's functional outcome.   REHAB POTENTIAL: {rehabpotential:25112}  CLINICAL DECISION MAKING: {clinical decision making:25114}  EVALUATION COMPLEXITY: {Evaluation complexity:25115}   GOALS: Goals reviewed with patient? {yes/no:20286}  SHORT TERM GOALS: Target date: ***  Pt will demonstrate appropriate understanding and performance of initially prescribed HEP in order to facilitate improved independence with management of symptoms.  Baseline: HEP ***  Goal status: INITIAL   2. Pt will report at least 25% improvement in overall pain levels over past week in order to facilitate improved tolerance to typical daily activities.   Baseline: ***  Goal status: INITIAL    LONG TERM GOALS: Target date: *** Pt will improve at least 20% on ODI in order to demonstrate improved perception of functional status due to symptoms.  Baseline: *** Goal status: INITIAL  2.  Pt will demonstrate *** lumbar AROM in order to demonstrate improved tolerance to functional movement patterns.  Baseline: *** Goal status: INITIAL  3.  Pt will demonstrate hip MMT of *** in order to demonstrate improved strength for functional movements.  Baseline: *** Goal status: INITIAL  4. Pt will perform 5xSTS in <*** sec in order to demonstrate reduced fall risk and improved functional independence. (MCID of 2.3sec)  Baseline: ***  Goal status: INITIAL   PLAN:  PT FREQUENCY: {rehab frequency:25116}  PT DURATION: {rehab duration:25117}  PLANNED INTERVENTIONS: {rehab planned interventions:25118::97110-Therapeutic  exercises,97530- Therapeutic [phone]- Neuromuscular re-education,97535- Self Rjmz,02859- Manual therapy}.  PLAN FOR NEXT SESSION: Review/update HEP PRN. Work on [employer] exercises as appropriate with emphasis on ***. Symptom modification strategies as indicated/appropriate.    Alm DELENA Jenny PT, DPT 06/24/2024 1:49 PM

## 2024-06-25 ENCOUNTER — Encounter: Payer: Self-pay | Admitting: Physical Medicine and Rehabilitation

## 2024-06-25 ENCOUNTER — Ambulatory Visit: Payer: Self-pay | Admitting: Nurse Practitioner

## 2024-06-25 ENCOUNTER — Ambulatory Visit: Admitting: Physical Medicine and Rehabilitation

## 2024-06-25 DIAGNOSIS — M5416 Radiculopathy, lumbar region: Secondary | ICD-10-CM

## 2024-06-25 DIAGNOSIS — M5441 Lumbago with sciatica, right side: Secondary | ICD-10-CM

## 2024-06-25 DIAGNOSIS — G8929 Other chronic pain: Secondary | ICD-10-CM | POA: Diagnosis not present

## 2024-06-25 DIAGNOSIS — M47816 Spondylosis without myelopathy or radiculopathy, lumbar region: Secondary | ICD-10-CM | POA: Diagnosis not present

## 2024-06-25 MED ORDER — TRAMADOL HCL 50 MG PO TABS: 50.0000 mg | ORAL_TABLET | Freq: Four times a day (QID) | ORAL | 0 refills | Status: AC | PRN

## 2024-06-25 NOTE — Progress Notes (Signed)
 Gina Burton - 53 y.o. female MRN [id number]  Date of birth: Feb 06, 1971  Office Visit Note: Visit Date: 06/25/2024 PCP: [name], NP Referred by: [name], NP  Subjective: Chief Complaint  Patient presents with   Lower Back - Pain   HPI: Gina Burton is a 53 y.o. female who comes in today per the request of [name] Persons, PA for evaluation of chronic, worsening and severe right sided lower back pain radiating down right lateral leg to great toe. Spanish interpreter at bedside. Pain started about 4 months ago, worsens with prolonged standing and walking. She describes pain as sore and aching sensation, currently rates as 8 out of 10. Some relief of pain with home exercise regimen, rest and use of medications. Minimal relief of pain with Meloxicam  and Robaxin . She recently started formal physical therapy. Recent lumbar radiographs show multi level degenerative changes, most prominent to lower lumbar spine where there is also disc height loss and facet arthropathy. No history of lumbar surgery/injections  Patient is currently working full time at [name]. Patient denies focal weakness, numbness and tingling. No recent trauma or falls.      Review of Systems  Musculoskeletal:  Positive for back pain.  Neurological:  Negative for tingling, sensory change, focal weakness and weakness.  All other systems reviewed and are negative.  Otherwise per HPI.  Assessment & Plan: Visit Diagnoses:    ICD-10-CM   1. Chronic right-sided low back pain with right-sided sciatica  M54.41    G89.29     2. Radiculopathy, lumbar region  M54.16     3. Facet arthropathy, lumbar  M47.816        Plan: Findings:  Chronic, worsening and severe right sided lower back pain radiating down right lateral leg to great toe. Patient continues to have severe pain despite good conservative therapies such as formal physical therapy, home exercise regimen, rest and use of  medications. Patients clinical presentation and exam are consistent with lumbar radiculopathy, more of L5 nerve pattern. I discussed treatment plan in detail today. Next step is to obtain lumbar MRI imaging. Depending on results of imaging we discussed possibility of performing lumbar epidural steroid injection. I also discussed medication management and prescribed short course of Tramadol  for her to try at bedtime. I encouraged her to continue with formal physical therapy. I will see her back for lumbar MRI review and to discuss options. She has no questions at this time. No red flag symptoms noted upon exam today.     Meds & Orders: No orders of the defined types were placed in this encounter.  No orders of the defined types were placed in this encounter.   Follow-up: Return for Lumbar MRI review.   Procedures: No procedures performed      Clinical History: No specialty comments available.   She reports that she has never smoked. She has never used smokeless tobacco.  Recent Labs    06/22/24 1357  HGBA1C 5.4    Objective:  VS:  HT:    WT:   BMI:     BP:   HR: bpm  TEMP: ( )  RESP:  Physical Exam Vitals and nursing note reviewed.  HENT:     Head: Normocephalic and atraumatic.     Right Ear: External ear normal.     Left Ear: External ear normal.     Nose: Nose normal.     Mouth/Throat:     Mouth: Mucous  membranes are moist.  Eyes:     Extraocular Movements: Extraocular movements intact.  Cardiovascular:     Rate and Rhythm: Normal rate.     Pulses: Normal pulses.  Pulmonary:     Effort: Pulmonary effort is normal.  Abdominal:     General: Abdomen is flat. There is no distension.  Musculoskeletal:        General: Tenderness present.     Cervical back: Normal range of motion.     Comments: Patient rises from seated position to standing without difficulty. Good lumbar range of motion. No pain noted with facet loading. 5/5 strength noted with bilateral hip flexion, knee  flexion/extension, ankle dorsiflexion/plantarflexion and EHL. No clonus noted bilaterally. No pain upon palpation of greater trochanters. No pain with internal/external rotation of bilateral hips. Sensation intact bilaterally. Dysesthesias noted to right L5 dermatome. Positive slump test on the right. Ambulates without aid, gait steady.     Skin:    General: Skin is warm and dry.     Capillary Refill: Capillary refill takes less than 2 seconds.  Neurological:     General: No focal deficit present.     Mental Status: She is alert and oriented to person, place, and time.  Psychiatric:        Mood and Affect: Mood normal.        Behavior: Behavior normal.     Ortho Exam  Imaging: No results found.  Past Medical/Family/Surgical/Social History: Medications & Allergies reviewed per EMR, new medications updated. Patient Active Problem List   Diagnosis Date Noted   Low back pain 06/04/2024   Pseudocyesis 10/08/2022   Fibroid uterus 09/07/2022   DUB (dysfunctional uterine bleeding) 09/07/2022   Grief at loss of child 07/24/2022   Adjustment disorder with depressed mood 02/01/2022   Menorrhagia with irregular cycle 06/13/2021   GAD (generalized anxiety disorder) 03/06/2021   Rib pain 03/06/2021   Psychophysiological insomnia 03/06/2021   Closed fracture of one rib of left side 03/06/2021   Vitamin D  deficiency 03/06/2021   Absolute anemia 03/06/2021   Hypokalemia 03/06/2021   Major depression, chronic 09/15/2020   Bipolar disorder, current episode hypomanic (HCC)    Asthma    Past Medical History:  Diagnosis Date   Asthma    Bipolar disorder, current episode hypomanic (HCC)    Family History  Problem Relation Age of Onset   Cancer Mother        Possibly Esophageal   Asthma Mother    COPD Mother        smoker   Bipolar disorder Mother        Possibly as well.  Not clear if diagnosed   Seizures Sister    Past Surgical History:  Procedure Laterality Date   arm surgery      CHOLECYSTECTOMY  1997   Social History   Occupational History   Not on file  Tobacco Use   Smoking status: Never   Smokeless tobacco: Never  Substance and Sexual Activity   Alcohol use: No   Drug use: No   Sexual activity: Not Currently

## 2024-06-25 NOTE — Progress Notes (Signed)
 Pain Scale   Average Pain 8 Patient advising she has lower back pain radiating to right leg, patient advising she started PT 06/24/24.        +Driver, -BT, -Dye Allergies.

## 2024-06-29 ENCOUNTER — Other Ambulatory Visit: Payer: Self-pay

## 2024-07-02 ENCOUNTER — Other Ambulatory Visit: Payer: Self-pay

## 2024-07-02 ENCOUNTER — Ambulatory Visit

## 2024-07-02 DIAGNOSIS — M5441 Lumbago with sciatica, right side: Secondary | ICD-10-CM | POA: Diagnosis not present

## 2024-07-02 DIAGNOSIS — M5459 Other low back pain: Secondary | ICD-10-CM

## 2024-07-02 DIAGNOSIS — M6281 Muscle weakness (generalized): Secondary | ICD-10-CM

## 2024-07-02 DIAGNOSIS — G8929 Other chronic pain: Secondary | ICD-10-CM | POA: Diagnosis not present

## 2024-07-02 DIAGNOSIS — M5442 Lumbago with sciatica, left side: Secondary | ICD-10-CM | POA: Diagnosis not present

## 2024-07-02 DIAGNOSIS — R2689 Other abnormalities of gait and mobility: Secondary | ICD-10-CM

## 2024-07-02 NOTE — Therapy (Unsigned)
 OUTPATIENT PHYSICAL THERAPY THORACOLUMBAR TREATMENT   Patient Name: Gina Burton MRN: [id number] DOB:05-04-1971, 53 y.o., female Today's Date: 07/03/2024  END OF SESSION:  PT End of Session - 07/02/24 1513     Visit Number 2    Number of Visits 17    Date for PT Re-Evaluation 08/20/24    Authorization Type [employer]    PT Start Time 1530    PT Stop Time 1615    PT Time Calculation (min) 45 min    Activity Tolerance Patient tolerated treatment well    Behavior During Therapy [facility] for tasks assessed/performed           Past Medical History:  Diagnosis Date   Asthma    Bipolar disorder, current episode hypomanic (HCC)    Past Surgical History:  Procedure Laterality Date   arm surgery     CHOLECYSTECTOMY  1997   Patient Active Problem List   Diagnosis Date Noted   Low back pain 06/04/2024   Pseudocyesis 10/08/2022   Fibroid uterus 09/07/2022   DUB (dysfunctional uterine bleeding) 09/07/2022   Grief at loss of child 07/24/2022   Adjustment disorder with depressed mood 02/01/2022   Menorrhagia with irregular cycle 06/13/2021   GAD (generalized anxiety disorder) 03/06/2021   Rib pain 03/06/2021   Psychophysiological insomnia 03/06/2021   Closed fracture of one rib of left side 03/06/2021   Vitamin D  deficiency 03/06/2021   Absolute anemia 03/06/2021   Hypokalemia 03/06/2021   Major depression, chronic 09/15/2020   Bipolar disorder, current episode hypomanic (HCC)    Asthma     PCP: [name], NP  REFERRING PROVIDER: Persons, [name], PA  REFERRING DIAG: G89.29,M54.41,M54.42 (ICD-10-CM) - Chronic bilateral low back pain with sciatica, sciatica laterality unspecified  Rationale for Evaluation and Treatment: Rehabilitation  THERAPY DIAG:  Other low back pain  Muscle weakness (generalized)  Other abnormalities of gait and mobility  ONSET DATE: Chronic  SUBJECTIVE:                                                                                                                                                                                            SUBJECTIVE STATEMENT:  Pt reports feeling better today after not having worked the past week. States that putting pressure thru leg causes the most discomfort 8-9/10. Reports not being able move her legin the mornings and uses horse linimen that seems to help with that.  EVAL: Pt presents to PT with reports of few month hx of increasing LBP. Has referral of symptoms as well as N/T down lateral R LE. Denies bowel/bladder changes or saddle  anesthesia. Works at [name] and her work shift greatly increases her pain. Denies any positional alleviation.   PERTINENT HISTORY:  asthma, bipolar disorder (hypomanic), GAD, insomnia  PAIN:  Are you having pain?  Yes: NPRS scale: 10/10 Worst: 10/10 Pain location: lower back  Pain description: sharp, numb Aggravating factors: standing, walking, lifting Relieving factors: rest, medication   PRECAUTIONS: None  RED FLAGS: None   WEIGHT BEARING RESTRICTIONS: No  FALLS:  Has patient fallen in last 6 months? No  LIVING ENVIRONMENT: Lives with: lives with their family Lives in: House/apartment  OCCUPATION: [name]  PLOF: Independent  PATIENT GOALS: decrease back and R LE pain, improve comfort with work  OBJECTIVE:  Note: Objective measures were completed at Evaluation unless otherwise noted.  DIAGNOSTIC FINDINGS:  June 2025 Lumbar XR: 2 view radiographs of the lumbar spine demonstrate overall well-maintained  alignment some degenerative changes at L4-5 and L5-S1 and facet  arthropathy   PATIENT SURVEYS:  ODI: 29/50  COGNITION: Overall cognitive status: Within functional limits for tasks assessed     SENSATION: WFL  LUMBAR ROM:   AROM eval  Flexion 25% p!  Extension 25% p!  Right lateral flexion   Left lateral flexion   Right rotation   Left rotation    (Blank rows = not tested) (Key: WFL = within  functional limits not formally assessed, * = concordant pain, s = stiffness/stretching sensation, NT = not tested) Comment:   LOWER EXTREMITY MMT:    MMT Right eval Left eval  Hip flexion 3 3  Hip abduction (modified sitting) 3 3  Hip internal rotation    Hip external rotation    Knee flexion 3+ 3+  Knee extension 3+ 3+  Ankle dorsiflexion     (Blank rows = not tested) (Key: WFL = within functional limits not formally assessed, * = concordant pain, s = stiffness/stretching sensation, NT = not tested)  Comments:    LUMBAR SPECIAL TESTS:   Slump test:   R: (+)   L: (-)   FUNCTIONAL TESTS:  5xSTS/30secSTS: 4 reps   GAIT: Distance walked: within clinic Assistive device utilized: None Level of assistance: Complete Independence Comments: trunk flexed, decreased R stance  TREATMENT DATE:  [facility] Adult PT Treatment:                                                DATE: 07/02/24 Therapeutic Exercise: Nustep level 3 for 3 min Open book L SL 2x10 Clamshells L SL 1x5 iso Supine PPT 1x10 hold with ball Supine bridges with ball hold partial range 2x10 LTRs limited range 1x10 SKTC 1x30 sec Physioball roll outs center, L, R 2x10 Seated LAQ partial range with R 1x10 Supine sciaitc nerve glide x 10 R         [facility] Adult PT Treatment:                                                DATE: 06/24/24 Therapeutic Exercise: Supine PPT x 5 - 5 hold Supine sciaitc nerve glide x 10 R DKTC x 30 Manual Therapy: STM to R sided lumbar paraspinals  PATIENT EDUCATION:  Education details: Pt education on PT impairments, prognosis, and POC. Informed consent. Rationale for interventions, safe/appropriate HEP performance Person educated: Patient Education method: Explanation, Demonstration, Tactile cues, Verbal cues Education comprehension: verbalized understanding, returned demonstration,  verbal cues required, tactile cues required, and needs further education    HOME EXERCISE PROGRAM: Access Code: [email or web address] URL: [email or web address] Date: 06/25/2024 Prepared by: Alm Kingdom  Exercises - Supine Posterior Pelvic Tilt  - 2 x daily - 7 x weekly - 2 sets - 10 reps - 5 sec hold - Supine Sciatic Nerve Glide  - 2 x daily - 7 x weekly - 2 sets - 10 reps - Supine Double Knee to Chest  - 2 x daily - 7 x weekly - 3 sets - 2 reps - 30 sec hold  ASSESSMENT:  CLINICAL IMPRESSION:  The pt came into the clinic today with very high pain. Translator not present until about 10 minutes into session. Pt struggled to complete most supine exercises through the full range of motion d/t significant pain. Unable to perform active clamshells. Pt still struggles to perform active PPTs, attempted tactical and verbal cueing, should continue to attempt this next session. Pt responded well to ball rollouts and said that they felt good. Discussed with the patient places to purchase a ball at home. Nerve glides caused a lot of pain, patient only able to complete at 15 deg hip flexion with slight knee bend. Assess response to treatment next visit and plan to progress if patients pain is reduced.   EVAL: Patient is a 53 y.o. woman who was seen today for physical therapy evaluation and treatment for back pain. Physical findings are consistent with referring provider impression as pt has positive neural tension findings on RLE, decreased proximal hip strength, and significantly reduced functional mobility. Pt ODI score shows severe disability in performance of home ADLs and community activities. She would benefit from skilled PT services working on improving core and hip strength to reduce pain and neural tension.    OBJECTIVE IMPAIRMENTS: Abnormal gait, decreased activity tolerance, decreased balance, decreased endurance, decreased mobility, difficulty walking, decreased ROM, decreased strength, and  pain.   ACTIVITY LIMITATIONS: carrying, lifting, standing, squatting, stairs, transfers, and locomotion level  PARTICIPATION LIMITATIONS: meal prep, cleaning, driving, shopping, community activity, occupation, yard work, and school  PERSONAL FACTORS: Time since onset of injury/illness/exacerbation and 3+ comorbidities: asthma, bipolar disorder (hypomanic), GAD, insomnia are also affecting patient's functional outcome.   REHAB POTENTIAL: Good  CLINICAL DECISION MAKING: Stable/uncomplicated  EVALUATION COMPLEXITY: Low   GOALS: Goals reviewed with patient? No  SHORT TERM GOALS: Target date: 07/16/2024    Pt will demonstrate appropriate understanding and performance of initially prescribed HEP in order to facilitate improved independence with management of symptoms.  Baseline: HEP given  Goal status: INITIAL   2. Pt will self report low back pain no greater than 7/10 for improved comfort and functional ability Baseline: 10/10 at worst Goal status: INITIAL   LONG TERM GOALS: Target date: 08/20/2024   Pt will improve at least 20% (19/50) on ODI in order to demonstrate improved perception of functional status due to symptoms.  Baseline: 58% (29/50) Goal status: INITIAL  2.  Pt will demonstrate improved lumbar AROM to at least 75% in order to demonstrate improved tolerance to functional movement patterns.  Baseline: see ROM chart Goal status: INITIAL  3.  Pt will demonstrate hip MMT of 4/5 in order to demonstrate improved strength for functional movements.  Baseline: see MMT  chart Goal status: INITIAL  4. Pt will increase 30 Second Sit to Stand rep count to no less than 9 reps for improved balance, strength, and functional mobility Baseline: 4 reps  Goal status: INITIAL   PLAN:  PT FREQUENCY: 2x/week  PT DURATION: 8 weeks  PLANNED INTERVENTIONS: 97164- PT Re-evaluation, 97110-Therapeutic exercises, 97530- Therapeutic activity, [id number]- Neuromuscular re-education, 97535- Self  Care, 02859- Manual therapy, G0283- Electrical stimulation (unattended), [id number]- Electrical stimulation (manual), 20560 (1-2 muscles), 20561 (3+ muscles)- Dry Needling, Cryotherapy, and Moist heat.  PLAN FOR NEXT SESSION: Review/update HEP PRN. Work on [employer] exercises as appropriate with emphasis on neutral spine core strength and proximal hip. Symptom modification strategies as indicated/appropriate.   For all possible CPT codes, reference the Planned Interventions line above.     Check all conditions that are expected to impact treatment: {Conditions expected to impact treatment:Musculoskeletal disorders   If treatment provided at initial evaluation, no treatment charged due to lack of authorization.     Iridiana Fonner, [place] 07/03/24 8:44 AM

## 2024-07-06 ENCOUNTER — Ambulatory Visit

## 2024-07-06 DIAGNOSIS — M6281 Muscle weakness (generalized): Secondary | ICD-10-CM

## 2024-07-06 DIAGNOSIS — G8929 Other chronic pain: Secondary | ICD-10-CM | POA: Diagnosis not present

## 2024-07-06 DIAGNOSIS — M5442 Lumbago with sciatica, left side: Secondary | ICD-10-CM | POA: Diagnosis not present

## 2024-07-06 DIAGNOSIS — R2689 Other abnormalities of gait and mobility: Secondary | ICD-10-CM | POA: Diagnosis not present

## 2024-07-06 DIAGNOSIS — M5459 Other low back pain: Secondary | ICD-10-CM | POA: Diagnosis not present

## 2024-07-06 DIAGNOSIS — M5441 Lumbago with sciatica, right side: Secondary | ICD-10-CM | POA: Diagnosis not present

## 2024-07-06 NOTE — Therapy (Signed)
 OUTPATIENT PHYSICAL THERAPY THORACOLUMBAR TREATMENT   Patient Name: Gina Burton MRN: [id number] DOB:[birth year], 53 y.o., female Today's Date: 07/06/2024  END OF SESSION:  PT End of Session - 07/06/24 1136     Visit Number 3    Number of Visits 17    Date for PT Re-Evaluation 08/20/24    Authorization Type [employer]    PT Start Time 1145    PT Stop Time 1228    PT Time Calculation (min) 43 min    Activity Tolerance Patient tolerated treatment well    Behavior During Therapy [name] for tasks assessed/performed           Past Medical History:  Diagnosis Date   Asthma    Bipolar disorder, current episode hypomanic (HCC)    Past Surgical History:  Procedure Laterality Date   arm surgery     CHOLECYSTECTOMY  1997   Patient Active Problem List   Diagnosis Date Noted   Low back pain 06/04/2024   Pseudocyesis 10/08/2022   Fibroid uterus 09/07/2022   DUB (dysfunctional uterine bleeding) 09/07/2022   Grief at loss of child 07/24/2022   Adjustment disorder with depressed mood 02/01/2022   Menorrhagia with irregular cycle 06/13/2021   GAD (generalized anxiety disorder) 03/06/2021   Rib pain 03/06/2021   Psychophysiological insomnia 03/06/2021   Closed fracture of one rib of left side 03/06/2021   Vitamin D  deficiency 03/06/2021   Absolute anemia 03/06/2021   Hypokalemia 03/06/2021   Major depression, chronic 09/15/2020   Bipolar disorder, current episode hypomanic (HCC)    Asthma     PCP: [name], NP  REFERRING PROVIDER: Persons, [name], PA  REFERRING DIAG: G89.29,M54.41,M54.42 (ICD-10-CM) - Chronic bilateral low back pain with sciatica, sciatica laterality unspecified  Rationale for Evaluation and Treatment: Rehabilitation  THERAPY DIAG:  Other low back pain  Muscle weakness (generalized)  Other abnormalities of gait and mobility  ONSET DATE: Chronic  SUBJECTIVE:                                                                                                                                                                                            SUBJECTIVE STATEMENT: Pt presents to PT with reports of decreased pain after last session. Also notes taking her medication this morning seems to have helped.   EVAL: Pt presents to PT with reports of few month hx of increasing LBP. Has referral of symptoms as well as N/T down lateral R LE. Denies bowel/bladder changes or saddle anesthesia. Works at [name] and her work shift greatly increases her pain. Denies any positional alleviation.   PERTINENT  HISTORY:  asthma, bipolar disorder (hypomanic), GAD, insomnia  PAIN:  Are you having pain?  Yes: NPRS scale: 10/10 Worst: 10/10 Pain location: lower back  Pain description: sharp, numb Aggravating factors: standing, walking, lifting Relieving factors: rest, medication   PRECAUTIONS: None  RED FLAGS: None   WEIGHT BEARING RESTRICTIONS: No  FALLS:  Has patient fallen in last 6 months? No  LIVING ENVIRONMENT: Lives with: lives with their family Lives in: House/apartment  OCCUPATION: [name]  PLOF: Independent  PATIENT GOALS: decrease back and R LE pain, improve comfort with work  OBJECTIVE:  Note: Objective measures were completed at Evaluation unless otherwise noted.  DIAGNOSTIC FINDINGS:  June 2025 Lumbar XR: 2 view radiographs of the lumbar spine demonstrate overall well-maintained  alignment some degenerative changes at L4-5 and L5-S1 and facet  arthropathy   PATIENT SURVEYS:  ODI: 29/50  COGNITION: Overall cognitive status: Within functional limits for tasks assessed     SENSATION: WFL  LUMBAR ROM:   AROM eval  Flexion 25% p!  Extension 25% p!  Right lateral flexion   Left lateral flexion   Right rotation   Left rotation    (Blank rows = not tested) (Key: WFL = within functional limits not formally assessed, * = concordant pain, s = stiffness/stretching sensation, NT = not  tested) Comment:   LOWER EXTREMITY MMT:    MMT Right eval Left eval  Hip flexion 3 3  Hip abduction (modified sitting) 3 3  Hip internal rotation    Hip external rotation    Knee flexion 3+ 3+  Knee extension 3+ 3+  Ankle dorsiflexion     (Blank rows = not tested) (Key: WFL = within functional limits not formally assessed, * = concordant pain, s = stiffness/stretching sensation, NT = not tested)  Comments:    LUMBAR SPECIAL TESTS:   Slump test:   R: (+)   L: (-)   FUNCTIONAL TESTS:  5xSTS/30secSTS: 4 reps   GAIT: Distance walked: within clinic Assistive device utilized: None Level of assistance: Complete Independence Comments: trunk flexed, decreased R stance  TREATMENT DATE:  [facility] Adult PT Treatment:                                                DATE: 07/06/24 Therapeutic Exercise: Nustep level 3 for 3 min Open book L SL 2x10 Clamshells L SL 2x10 LTR x 10 ea Bridge (small range) 2x10 Prone press up - increased pain POE x 45 Physioball roll outs center, L, R 2x10 ea Lateral walk at counter x 3 laps  [facility] Adult PT Treatment:                                                DATE: 07/02/24 Therapeutic Exercise: Nustep level 3 for 3 min Open book L SL 2x10 Clamshells L SL 1x5 iso Supine PPT 1x10 hold with ball Supine bridges with ball hold partial range 2x10 LTRs limited range 1x10 SKTC 1x30 sec Physioball roll outs center, L, R 2x10 Seated LAQ partial range with R 1x10 Supine sciaitc nerve glide x 10 R  [facility] Adult PT Treatment:  DATE: 06/24/24 Therapeutic Exercise: Supine PPT x 5 - 5 hold Supine sciaitc nerve glide x 10 R DKTC x 30 Manual Therapy: STM to R sided lumbar paraspinals                                                                                                               PATIENT EDUCATION:  Education details: Pt education on PT impairments, prognosis, and POC. Informed consent. Rationale  for interventions, safe/appropriate HEP performance Person educated: Patient Education method: Explanation, Demonstration, Tactile cues, Verbal cues Education comprehension: verbalized understanding, returned demonstration, verbal cues required, tactile cues required, and needs further education    HOME EXERCISE PROGRAM: Access Code: [email or web address] URL: [email or web address] Date: 06/25/2024 Prepared by: Alm Kingdom  Exercises - Supine Posterior Pelvic Tilt  - 2 x daily - 7 x weekly - 2 sets - 10 reps - 5 sec hold - Supine Sciatic Nerve Glide  - 2 x daily - 7 x weekly - 2 sets - 10 reps - Supine Double Knee to Chest  - 2 x daily - 7 x weekly - 3 sets - 2 reps - 30 sec hold  ASSESSMENT:  CLINICAL IMPRESSION: Pt was able to complete all prescribed exercises today with no adverse effect with exception of increased symptoms with lumbar extension based exercises. We continued to focus on improving core and proximal hip strength today along with flexion based stretches that seem to alleviate discomfort.   EVAL: Patient is a 53 y.o. woman who was seen today for physical therapy evaluation and treatment for back pain. Physical findings are consistent with referring provider impression as pt has positive neural tension findings on RLE, decreased proximal hip strength, and significantly reduced functional mobility. Pt ODI score shows severe disability in performance of home ADLs and community activities. She would benefit from skilled PT services working on improving core and hip strength to reduce pain and neural tension.    OBJECTIVE IMPAIRMENTS: Abnormal gait, decreased activity tolerance, decreased balance, decreased endurance, decreased mobility, difficulty walking, decreased ROM, decreased strength, and pain.   ACTIVITY LIMITATIONS: carrying, lifting, standing, squatting, stairs, transfers, and locomotion level  PARTICIPATION LIMITATIONS: meal prep, cleaning, driving, shopping,  community activity, occupation, yard work, and school  PERSONAL FACTORS: Time since onset of injury/illness/exacerbation and 3+ comorbidities: asthma, bipolar disorder (hypomanic), GAD, insomnia are also affecting patient's functional outcome.   REHAB POTENTIAL: Good  CLINICAL DECISION MAKING: Stable/uncomplicated  EVALUATION COMPLEXITY: Low   GOALS: Goals reviewed with patient? No  SHORT TERM GOALS: Target date: 07/16/2024    Pt will demonstrate appropriate understanding and performance of initially prescribed HEP in order to facilitate improved independence with management of symptoms.  Baseline: HEP given  Goal status: INITIAL   2. Pt will self report low back pain no greater than 7/10 for improved comfort and functional ability Baseline: 10/10 at worst Goal status: INITIAL   LONG TERM GOALS: Target date: 08/20/2024   Pt will improve at least 20% (19/50) on ODI in order to demonstrate improved perception of functional status  due to symptoms.  Baseline: 58% (29/50) Goal status: INITIAL  2.  Pt will demonstrate improved lumbar AROM to at least 75% in order to demonstrate improved tolerance to functional movement patterns.  Baseline: see ROM chart Goal status: INITIAL  3.  Pt will demonstrate hip MMT of 4/5 in order to demonstrate improved strength for functional movements.  Baseline: see MMT chart Goal status: INITIAL  4. Pt will increase 30 Second Sit to Stand rep count to no less than 9 reps for improved balance, strength, and functional mobility Baseline: 4 reps  Goal status: INITIAL   PLAN:  PT FREQUENCY: 2x/week  PT DURATION: 8 weeks  PLANNED INTERVENTIONS: 97164- PT Re-evaluation, 97110-Therapeutic exercises, 97530- Therapeutic activity, [id number]- Neuromuscular re-education, 97535- Self Care, 02859- Manual therapy, G0283- Electrical stimulation (unattended), [id number]- Electrical stimulation (manual), 20560 (1-2 muscles), 20561 (3+ muscles)- Dry Needling, Cryotherapy,  and Moist heat.  PLAN FOR NEXT SESSION: Review/update HEP PRN. Work on [employer] exercises as appropriate with emphasis on neutral spine core strength and proximal hip. Symptom modification strategies as indicated/appropriate.   For all possible CPT codes, reference the Planned Interventions line above.     Check all conditions that are expected to impact treatment: {Conditions expected to impact treatment:Musculoskeletal disorders   If treatment provided at initial evaluation, no treatment charged due to lack of authorization.     Alm JAYSON Kingdom PT  07/06/24 1:08 PM

## 2024-07-08 ENCOUNTER — Ambulatory Visit

## 2024-07-08 DIAGNOSIS — R2689 Other abnormalities of gait and mobility: Secondary | ICD-10-CM

## 2024-07-08 DIAGNOSIS — M6281 Muscle weakness (generalized): Secondary | ICD-10-CM | POA: Diagnosis not present

## 2024-07-08 DIAGNOSIS — M5442 Lumbago with sciatica, left side: Secondary | ICD-10-CM | POA: Diagnosis not present

## 2024-07-08 DIAGNOSIS — M5459 Other low back pain: Secondary | ICD-10-CM | POA: Diagnosis not present

## 2024-07-08 DIAGNOSIS — M5441 Lumbago with sciatica, right side: Secondary | ICD-10-CM | POA: Diagnosis not present

## 2024-07-08 DIAGNOSIS — G8929 Other chronic pain: Secondary | ICD-10-CM | POA: Diagnosis not present

## 2024-07-08 NOTE — Therapy (Signed)
 OUTPATIENT PHYSICAL THERAPY THORACOLUMBAR TREATMENT   Patient Name: Gina Burton MRN: [id number] DOB:27-Jun-1971, 53 y.o., female Today's Date: 07/08/2024  END OF SESSION:  PT End of Session - 07/08/24 1318     Visit Number 4    Number of Visits 17    Date for PT Re-Evaluation 08/20/24    Authorization Type [employer]    PT Start Time 1145    PT Stop Time 1230    PT Time Calculation (min) 45 min    Activity Tolerance Patient tolerated treatment well    Behavior During Therapy [facility] for tasks assessed/performed            Past Medical History:  Diagnosis Date   Asthma    Bipolar disorder, current episode hypomanic (HCC)    Past Surgical History:  Procedure Laterality Date   arm surgery     CHOLECYSTECTOMY  1997   Patient Active Problem List   Diagnosis Date Noted   Low back pain 06/04/2024   Pseudocyesis 10/08/2022   Fibroid uterus 09/07/2022   DUB (dysfunctional uterine bleeding) 09/07/2022   Grief at loss of child 07/24/2022   Adjustment disorder with depressed mood 02/01/2022   Menorrhagia with irregular cycle 06/13/2021   GAD (generalized anxiety disorder) 03/06/2021   Rib pain 03/06/2021   Psychophysiological insomnia 03/06/2021   Closed fracture of one rib of left side 03/06/2021   Vitamin D  deficiency 03/06/2021   Absolute anemia 03/06/2021   Hypokalemia 03/06/2021   Major depression, chronic 09/15/2020   Bipolar disorder, current episode hypomanic (HCC)    Asthma     PCP: [name], NP  REFERRING PROVIDER: Persons, [name], [place]  REFERRING DIAG: G89.29,M54.41,M54.42 (ICD-10-CM) - Chronic bilateral low back pain with sciatica, sciatica laterality unspecified  Rationale for Evaluation and Treatment: Rehabilitation  THERAPY DIAG:  No diagnosis found.  ONSET DATE: Chronic  SUBJECTIVE:                                                                                                                                                                                            SUBJECTIVE STATEMENT: Pt reports 9/10 pain, bad day at work, and got very little sleep last night   EVAL: Pt presents to PT with reports of few month hx of increasing LBP. Has referral of symptoms as well as N/T down lateral R LE. Denies bowel/bladder changes or saddle anesthesia. Works at [name] and her work shift greatly increases her pain. Denies any positional alleviation.   PERTINENT HISTORY:  asthma, bipolar disorder (hypomanic), GAD, insomnia  PAIN:  Are you having pain?  Yes: NPRS scale:  10/10 Worst: 10/10 Pain location: lower back  Pain description: sharp, numb Aggravating factors: standing, walking, lifting Relieving factors: rest, medication   PRECAUTIONS: None  RED FLAGS: None   WEIGHT BEARING RESTRICTIONS: No  FALLS:  Has patient fallen in last 6 months? No  LIVING ENVIRONMENT: Lives with: lives with their family Lives in: House/apartment  OCCUPATION: [name]  PLOF: Independent  PATIENT GOALS: decrease back and R LE pain, improve comfort with work  OBJECTIVE:  Note: Objective measures were completed at Evaluation unless otherwise noted.  DIAGNOSTIC FINDINGS:  June 2025 Lumbar XR: 2 view radiographs of the lumbar spine demonstrate overall well-maintained  alignment some degenerative changes at L4-5 and L5-S1 and facet  arthropathy   PATIENT SURVEYS:  ODI: 29/50  COGNITION: Overall cognitive status: Within functional limits for tasks assessed     SENSATION: WFL  LUMBAR ROM:   AROM eval  Flexion 25% p!  Extension 25% p!  Right lateral flexion   Left lateral flexion   Right rotation   Left rotation    (Blank rows = not tested) (Key: WFL = within functional limits not formally assessed, * = concordant pain, s = stiffness/stretching sensation, NT = not tested) Comment:   LOWER EXTREMITY MMT:    MMT Right eval Left eval  Hip flexion 3 3  Hip abduction (modified sitting) 3 3  Hip  internal rotation    Hip external rotation    Knee flexion 3+ 3+  Knee extension 3+ 3+  Ankle dorsiflexion     (Blank rows = not tested) (Key: WFL = within functional limits not formally assessed, * = concordant pain, s = stiffness/stretching sensation, NT = not tested)  Comments:    LUMBAR SPECIAL TESTS:   Slump test:   R: (+)   L: (-)   FUNCTIONAL TESTS:  5xSTS/30secSTS: 4 reps   GAIT: Distance walked: within clinic Assistive device utilized: None Level of assistance: Complete Independence Comments: trunk flexed, decreased R stance  TREATMENT DATE:   [facility] Adult PT Treatment:                                                DATE: 07/08/24 Therapeutic Exercise: Nustep level 3 for 3 min Knee/hip flexion over physioball Open book L SL 1x10 Clamshells L SL 1x10 LTR x 10 ea Supine abduction with RTB around knees x5 pain Seat lumbar extension with PPT cues 2x10 with moist heat Physioball roll outs center on table L, R 2x10 ea Lateral walk at counter x 3 laps Manual Therapy Traction over orange physioball with lateral rotation and oscillations  [facility] Adult PT Treatment:                                                DATE: 07/06/24 Therapeutic Exercise: Nustep level 3 for 3 min Open book L SL 2x10 Clamshells L SL 2x10 LTR x 10 ea Bridge (small range) 2x10 Prone press up - increased pain POE x 45 Physioball roll outs center, L, R 2x10 ea Lateral walk at counter x 3 laps    [facility] Adult PT Treatment:  DATE: 07/02/24 Therapeutic Exercise: Nustep level 3 for 3 min Open book L SL 2x10 Clamshells L SL 1x5 iso Supine PPT 1x10 hold with ball Supine bridges with ball hold partial range 2x10 LTRs limited range 1x10 SKTC 1x30 sec Physioball roll outs center, L, R 2x10 Seated LAQ partial range with R 1x10 Supine sciaitc nerve glide x 10 R  [facility] Adult PT Treatment:                                                DATE:  06/24/24 Therapeutic Exercise: Supine PPT x 5 - 5 hold Supine sciaitc nerve glide x 10 R DKTC x 30 Manual Therapy: STM to R sided lumbar paraspinals                                                                                                               PATIENT EDUCATION:  Education details: Pt education on PT impairments, prognosis, and POC. Informed consent. Rationale for interventions, safe/appropriate HEP performance Person educated: Patient Education method: Explanation, Demonstration, Tactile cues, Verbal cues Education comprehension: verbalized understanding, returned demonstration, verbal cues required, tactile cues required, and needs further education    HOME EXERCISE PROGRAM: Access Code: [email or web address] URL: [email or web address] Date: 06/25/2024 Prepared by: Alm Kingdom  Exercises - Supine Posterior Pelvic Tilt  - 2 x daily - 7 x weekly - 2 sets - 10 reps - 5 sec hold - Supine Sciatic Nerve Glide  - 2 x daily - 7 x weekly - 2 sets - 10 reps - Supine Double Knee to Chest  - 2 x daily - 7 x weekly - 3 sets - 2 reps - 30 sec hold  ASSESSMENT:  CLINICAL IMPRESSION:  Pt came in in a lot of pain today. Increased pain with all bridge variations. Applied heat during seated lumbar mobility exercises which decreased pain reported. Patient is consistently in a lot of pain with all lumbar motions besides flexion. Pt was able to perform ball rollouts on an elevated platform which resulted in no nausea symptoms.Will continue to assess benefit from therapy given her lack of reduction in symptoms.  EVAL: Patient is a 53 y.o. woman who was seen today for physical therapy evaluation and treatment for back pain. Physical findings are consistent with referring provider impression as pt has positive neural tension findings on RLE, decreased proximal hip strength, and significantly reduced functional mobility. Pt ODI score shows severe disability in performance of home ADLs and  community activities. She would benefit from skilled PT services working on improving core and hip strength to reduce pain and neural tension.    OBJECTIVE IMPAIRMENTS: Abnormal gait, decreased activity tolerance, decreased balance, decreased endurance, decreased mobility, difficulty walking, decreased ROM, decreased strength, and pain.   ACTIVITY LIMITATIONS: carrying, lifting, standing, squatting, stairs, transfers, and locomotion level  PARTICIPATION LIMITATIONS: meal prep, cleaning, driving, shopping, community activity, occupation, yard work, and school  PERSONAL FACTORS: Time since onset of injury/illness/exacerbation and 3+ comorbidities: asthma, bipolar disorder (hypomanic), GAD, insomnia are also affecting patient's functional outcome.   REHAB POTENTIAL: Good  CLINICAL DECISION MAKING: Stable/uncomplicated  EVALUATION COMPLEXITY: Low   GOALS: Goals reviewed with patient? No  SHORT TERM GOALS: Target date: 07/16/2024    Pt will demonstrate appropriate understanding and performance of initially prescribed HEP in order to facilitate improved independence with management of symptoms.  Baseline: HEP given  Goal status: INITIAL   2. Pt will self report low back pain no greater than 7/10 for improved comfort and functional ability Baseline: 10/10 at worst Goal status: INITIAL   LONG TERM GOALS: Target date: 08/20/2024   Pt will improve at least 20% (19/50) on ODI in order to demonstrate improved perception of functional status due to symptoms.  Baseline: 58% (29/50) Goal status: INITIAL  2.  Pt will demonstrate improved lumbar AROM to at least 75% in order to demonstrate improved tolerance to functional movement patterns.  Baseline: see ROM chart Goal status: INITIAL  3.  Pt will demonstrate hip MMT of 4/5 in order to demonstrate improved strength for functional movements.  Baseline: see MMT chart Goal status: INITIAL  4. Pt will increase 30 Second Sit to Stand rep count  to no less than 9 reps for improved balance, strength, and functional mobility Baseline: 4 reps  Goal status: INITIAL   PLAN:  PT FREQUENCY: 2x/week  PT DURATION: 8 weeks  PLANNED INTERVENTIONS: 97164- PT Re-evaluation, 97110-Therapeutic exercises, 97530- Therapeutic activity, [id number]- Neuromuscular re-education, 97535- Self Care, 02859- Manual therapy, G0283- Electrical stimulation (unattended), [id number]- Electrical stimulation (manual), 20560 (1-2 muscles), 20561 (3+ muscles)- Dry Needling, Cryotherapy, and Moist heat.  PLAN FOR NEXT SESSION: Review/update HEP PRN. Work on [employer] exercises as appropriate with emphasis on neutral spine core strength and proximal hip. Symptom modification strategies as indicated/appropriate.   For all possible CPT codes, reference the Planned Interventions line above.     Check all conditions that are expected to impact treatment: {Conditions expected to impact treatment:Musculoskeletal disorders   If treatment provided at initial evaluation, no treatment charged due to lack of authorization.      Dalayna Lauter, [place] 07/08/24 1:18 PM

## 2024-07-13 ENCOUNTER — Ambulatory Visit

## 2024-07-13 ENCOUNTER — Emergency Department (HOSPITAL_COMMUNITY)
Admission: EM | Admit: 2024-07-13 | Discharge: 2024-07-14 | Disposition: A | Discharge status: Home / Self Care | Attending: Emergency Medicine | Admitting: Emergency Medicine

## 2024-07-13 ENCOUNTER — Encounter (HOSPITAL_COMMUNITY): Payer: Self-pay

## 2024-07-13 DIAGNOSIS — M5441 Lumbago with sciatica, right side: Secondary | ICD-10-CM | POA: Diagnosis not present

## 2024-07-13 DIAGNOSIS — M5459 Other low back pain: Secondary | ICD-10-CM | POA: Diagnosis not present

## 2024-07-13 DIAGNOSIS — M6281 Muscle weakness (generalized): Secondary | ICD-10-CM | POA: Diagnosis not present

## 2024-07-13 DIAGNOSIS — R2689 Other abnormalities of gait and mobility: Secondary | ICD-10-CM | POA: Diagnosis not present

## 2024-07-13 DIAGNOSIS — M5431 Sciatica, right side: Secondary | ICD-10-CM | POA: Diagnosis not present

## 2024-07-13 DIAGNOSIS — M545 Low back pain, unspecified: Secondary | ICD-10-CM | POA: Diagnosis not present

## 2024-07-13 DIAGNOSIS — R200 Anesthesia of skin: Secondary | ICD-10-CM | POA: Diagnosis present

## 2024-07-13 DIAGNOSIS — M5442 Lumbago with sciatica, left side: Secondary | ICD-10-CM | POA: Diagnosis not present

## 2024-07-13 DIAGNOSIS — M5106 Intervertebral disc disorders with myelopathy, lumbar region: Secondary | ICD-10-CM | POA: Diagnosis not present

## 2024-07-13 DIAGNOSIS — G8929 Other chronic pain: Secondary | ICD-10-CM | POA: Diagnosis not present

## 2024-07-13 NOTE — ED Triage Notes (Signed)
 Pt states that she has sciatic nerve pain on the R side, has seen ortho and has MRI ordered for the 14th but pain is unbearable and she can not wait that long

## 2024-07-13 NOTE — Therapy (Signed)
 OUTPATIENT PHYSICAL THERAPY TREATMENT   Patient Name: Gina Burton MRN: [id number] DOB:08-15-1971, 53 y.o., female Today's Date: 07/14/2024  END OF SESSION:  PT End of Session - 07/13/24 1137     Visit Number 5    Number of Visits 17    Date for PT Re-Evaluation 08/20/24    Authorization Type [employer]    PT Start Time 1145    PT Stop Time 1223    PT Time Calculation (min) 38 min    Activity Tolerance Patient tolerated treatment well    Behavior During Therapy [facility] for tasks assessed/performed             Past Medical History:  Diagnosis Date   Asthma    Bipolar disorder, current episode hypomanic (HCC)    Past Surgical History:  Procedure Laterality Date   arm surgery     CHOLECYSTECTOMY  1997   Patient Active Problem List   Diagnosis Date Noted   Low back pain 06/04/2024   Pseudocyesis 10/08/2022   Fibroid uterus 09/07/2022   DUB (dysfunctional uterine bleeding) 09/07/2022   Grief at loss of child 07/24/2022   Adjustment disorder with depressed mood 02/01/2022   Menorrhagia with irregular cycle 06/13/2021   GAD (generalized anxiety disorder) 03/06/2021   Rib pain 03/06/2021   Psychophysiological insomnia 03/06/2021   Closed fracture of one rib of left side 03/06/2021   Vitamin D  deficiency 03/06/2021   Absolute anemia 03/06/2021   Hypokalemia 03/06/2021   Major depression, chronic 09/15/2020   Bipolar disorder, current episode hypomanic (HCC)    Asthma     PCP: [name], NP  REFERRING PROVIDER: Persons, [name], [place]  REFERRING DIAG: G89.29,M54.41,M54.42 (ICD-10-CM) - Chronic bilateral low back pain with sciatica, sciatica laterality unspecified  Rationale for Evaluation and Treatment: Rehabilitation  THERAPY DIAG:  Other low back pain  Muscle weakness (generalized)  Other abnormalities of gait and mobility  ONSET DATE: Chronic  SUBJECTIVE:                                                                                                                                                                                            SUBJECTIVE STATEMENT: Pt presents to PT with continued reports of severe R LE pain. Feels like she needs an injection. Also notes new onset of urinary incontinence, denies fecal inconvenience but has some feels of altered sensation in perianal area.   EVAL: Pt presents to PT with reports of few month hx of increasing LBP. Has referral of symptoms as well as N/T down lateral R LE. Denies bowel/bladder changes or saddle anesthesia. Works at  [name] and her work shift greatly increases her pain. Denies any positional alleviation.   PERTINENT HISTORY:  asthma, bipolar disorder (hypomanic), GAD, insomnia  PAIN:  Are you having pain?  Yes: NPRS scale: 10/10 Worst: 10/10 Pain location: lower back  Pain description: sharp, numb Aggravating factors: standing, walking, lifting Relieving factors: rest, medication   PRECAUTIONS: None  RED FLAGS: None   WEIGHT BEARING RESTRICTIONS: No  FALLS:  Has patient fallen in last 6 months? No  LIVING ENVIRONMENT: Lives with: lives with their family Lives in: House/apartment  OCCUPATION: [name]  PLOF: Independent  PATIENT GOALS: decrease back and R LE pain, improve comfort with work  OBJECTIVE:  Note: Objective measures were completed at Evaluation unless otherwise noted.  DIAGNOSTIC FINDINGS:  June 2025 Lumbar XR: 2 view radiographs of the lumbar spine demonstrate overall well-maintained  alignment some degenerative changes at L4-5 and L5-S1 and facet  arthropathy   PATIENT SURVEYS:  ODI: 29/50  COGNITION: Overall cognitive status: Within functional limits for tasks assessed     SENSATION: WFL  LUMBAR ROM:   AROM eval  Flexion 25% p!  Extension 25% p!  Right lateral flexion   Left lateral flexion   Right rotation   Left rotation    (Blank rows = not tested) (Key: WFL = within functional limits not formally  assessed, * = concordant pain, s = stiffness/stretching sensation, NT = not tested) Comment:   LOWER EXTREMITY MMT:    MMT Right eval Left eval  Hip flexion 3 3  Hip abduction (modified sitting) 3 3  Hip internal rotation    Hip external rotation    Knee flexion 3+ 3+  Knee extension 3+ 3+  Ankle dorsiflexion     (Blank rows = not tested) (Key: WFL = within functional limits not formally assessed, * = concordant pain, s = stiffness/stretching sensation, NT = not tested)  Comments:    LUMBAR SPECIAL TESTS:   Slump test:   R: (+)   L: (-)   FUNCTIONAL TESTS:  5xSTS/30secSTS: 4 reps   GAIT: Distance walked: within clinic Assistive device utilized: None Level of assistance: Complete Independence Comments: trunk flexed, decreased R stance  TREATMENT DATE:  [facility] Adult PT Treatment:                                                DATE: 07/13/24 Therapeutic Exercise: Nustep lvl 4 for 3 min for functional activity tolerance LTR x 5 each Supine PPT x 5 - 5 hold Hooklying ball squeeze x 15 Self Care/Home Management: Discussed current management of symptoms, went over address for [facility] and imaging so she can set up MRI, once again reviewed cauda equina symptoms and discussed pt going to ED Modalities: MHP to lumbar paraspinals during supine exercises  PATIENT EDUCATION:  Education details: Pt education on PT impairments, prognosis, and POC. Informed consent. Rationale for interventions, safe/appropriate HEP performance Person educated: Patient Education method: Explanation, Demonstration, Tactile cues, Verbal cues Education comprehension: verbalized understanding, returned demonstration, verbal cues required, tactile cues required, and needs further education    HOME EXERCISE PROGRAM: Access Code: [email or web address] URL: [email or web address] Date: 06/25/2024 Prepared by: Alm Kingdom  Exercises - Supine Posterior Pelvic Tilt  - 2 x daily - 7 x weekly - 2 sets -  10 reps - 5 sec hold - Supine Sciatic Nerve Glide  - 2  x daily - 7 x weekly - 2 sets - 10 reps - Supine Double Knee to Chest  - 2 x daily - 7 x weekly - 3 sets - 2 reps - 30 sec hold  ASSESSMENT:  CLINICAL IMPRESSION: Pt tolerated treatment fair but was limited in exercises by severe pain. After she reported that she had recent urinary incontinence symptoms we tested her reflexes and further discussed how to identify red flag symptoms and recommended she go to ED. Also discussed POC and how current therapeutic benefit is questionable. Will continue as she tolerates but may need to d/c if she is unable to progress through therapy.   EVAL: Patient is a 53 y.o. woman who was seen today for physical therapy evaluation and treatment for back pain. Physical findings are consistent with referring provider impression as pt has positive neural tension findings on RLE, decreased proximal hip strength, and significantly reduced functional mobility. Pt ODI score shows severe disability in performance of home ADLs and community activities. She would benefit from skilled PT services working on improving core and hip strength to reduce pain and neural tension.    OBJECTIVE IMPAIRMENTS: Abnormal gait, decreased activity tolerance, decreased balance, decreased endurance, decreased mobility, difficulty walking, decreased ROM, decreased strength, and pain.   ACTIVITY LIMITATIONS: carrying, lifting, standing, squatting, stairs, transfers, and locomotion level  PARTICIPATION LIMITATIONS: meal prep, cleaning, driving, shopping, community activity, occupation, yard work, and school  PERSONAL FACTORS: Time since onset of injury/illness/exacerbation and 3+ comorbidities: asthma, bipolar disorder (hypomanic), GAD, insomnia are also affecting patient's functional outcome.   REHAB POTENTIAL: Good  CLINICAL DECISION MAKING: Stable/uncomplicated  EVALUATION COMPLEXITY: Low   GOALS: Goals reviewed with patient?  No  SHORT TERM GOALS: Target date: 07/16/2024    Pt will demonstrate appropriate understanding and performance of initially prescribed HEP in order to facilitate improved independence with management of symptoms.  Baseline: HEP given  Goal status: MET   2. Pt will self report low back pain no greater than 7/10 for improved comfort and functional ability Baseline: 10/10 at worst 07/13/2024: 10/10 at worst Goal status: IN PROGRESS   LONG TERM GOALS: Target date: 08/20/2024   Pt will improve at least 20% (19/50) on ODI in order to demonstrate improved perception of functional status due to symptoms.  Baseline: 58% (29/50) Goal status: INITIAL  2.  Pt will demonstrate improved lumbar AROM to at least 75% in order to demonstrate improved tolerance to functional movement patterns.  Baseline: see ROM chart Goal status: INITIAL  3.  Pt will demonstrate hip MMT of 4/5 in order to demonstrate improved strength for functional movements.  Baseline: see MMT chart Goal status: INITIAL  4. Pt will increase 30 Second Sit to Stand rep count to no less than 9 reps for improved balance, strength, and functional mobility Baseline: 4 reps  Goal status: INITIAL   PLAN:  PT FREQUENCY: 2x/week  PT DURATION: 8 weeks  PLANNED INTERVENTIONS: 97164- PT Re-evaluation, 97110-Therapeutic exercises, 97530- Therapeutic activity, [id number]- Neuromuscular re-education, 97535- Self Care, 02859- Manual therapy, G0283- Electrical stimulation (unattended), [id number]- Electrical stimulation (manual), 20560 (1-2 muscles), 20561 (3+ muscles)- Dry Needling, Cryotherapy, and Moist heat.  PLAN FOR NEXT SESSION: Review/update HEP PRN. Work on [employer] exercises as appropriate with emphasis on neutral spine core strength and proximal hip. Symptom modification strategies as indicated/appropriate.   For all possible CPT codes, reference the Planned Interventions line above.     Check all conditions that are expected to impact  treatment: {Conditions expected  to impact treatment:Musculoskeletal disorders   If treatment provided at initial evaluation, no treatment charged due to lack of authorization.      Alm JAYSON Kingdom PT  07/14/24 8:09 AM

## 2024-07-13 NOTE — ED Provider Triage Note (Signed)
 Emergency Medicine Provider Triage Evaluation Note  Gina Burton , a 53 y.o. female  was evaluated in triage.  Pt complains of worsening sciatica pain.  He does have an outpatient MRI scheduled.  She states that she was told if her pain worsens to come into the emergency department for an injection.  No new injury.  Ambulates with an antalgic gait  Review of Systems  Positive: As above Negative: As above  Physical Exam  BP 104/84 (BP Location: Right Arm)   Pulse 71   Temp 97.7 F (36.5 C)   Resp 16   SpO2 98%  Gen:   Awake, no distress   Resp:  Normal effort  MSK:   Moves extremities without difficulty  Other:    Medical Decision Making  Medically screening exam initiated at 9:19 PM.  Appropriate orders placed.  Gina Burton Gina Burton was informed that the remainder of the evaluation will be completed by another provider, this initial triage assessment does not replace that evaluation, and the importance of remaining in the ED until their evaluation is complete.     Gina Loge, PA-C 07/13/24 2120

## 2024-07-14 ENCOUNTER — Other Ambulatory Visit: Payer: Self-pay

## 2024-07-14 ENCOUNTER — Emergency Department (HOSPITAL_COMMUNITY)

## 2024-07-14 ENCOUNTER — Telehealth: Payer: Self-pay

## 2024-07-14 DIAGNOSIS — M5106 Intervertebral disc disorders with myelopathy, lumbar region: Secondary | ICD-10-CM | POA: Diagnosis not present

## 2024-07-14 DIAGNOSIS — M545 Low back pain, unspecified: Secondary | ICD-10-CM | POA: Diagnosis not present

## 2024-07-14 LAB — URINALYSIS, ROUTINE W REFLEX MICROSCOPIC
Bilirubin Urine: NEGATIVE
Glucose, UA: NEGATIVE mg/dL
Hgb urine dipstick: NEGATIVE
Ketones, ur: NEGATIVE mg/dL
Nitrite: NEGATIVE
Protein, ur: NEGATIVE mg/dL
Specific Gravity, Urine: 1.019 (ref 1.005–1.030)
pH: 6 (ref 5.0–8.0)

## 2024-07-14 LAB — CBC WITH DIFFERENTIAL/PLATELET
Abs Immature Granulocytes: 0.03 K/uL (ref 0.00–0.07)
Basophils Absolute: 0.1 K/uL (ref 0.0–0.1)
Basophils Relative: 1 %
Eosinophils Absolute: 0.5 K/uL (ref 0.0–0.5)
Eosinophils Relative: 5 %
HCT: 35.6 % — ABNORMAL LOW (ref 36.0–46.0)
Hemoglobin: 11 g/dL — ABNORMAL LOW (ref 12.0–15.0)
Immature Granulocytes: 0 %
Lymphocytes Relative: 26 %
Lymphs Abs: 2.5 K/uL (ref 0.7–4.0)
MCH: 24.9 pg — ABNORMAL LOW (ref 26.0–34.0)
MCHC: 30.9 g/dL (ref 30.0–36.0)
MCV: 80.5 fL (ref 80.0–100.0)
Monocytes Absolute: 0.9 K/uL (ref 0.1–1.0)
Monocytes Relative: 10 %
Neutro Abs: 5.6 K/uL (ref 1.7–7.7)
Neutrophils Relative %: 58 %
Platelets: 270 K/uL (ref 150–400)
RBC: 4.42 MIL/uL (ref 3.87–5.11)
RDW: 17.5 % — ABNORMAL HIGH (ref 11.5–15.5)
WBC: 9.6 K/uL (ref 4.0–10.5)
nRBC: 0 % (ref 0.0–0.2)

## 2024-07-14 LAB — BASIC METABOLIC PANEL WITH GFR
Anion gap: 10 (ref 5–15)
BUN: 20 mg/dL (ref 6–20)
CO2: 22 mmol/L (ref 22–32)
Calcium: 8.6 mg/dL — ABNORMAL LOW (ref 8.9–10.3)
Chloride: 106 mmol/L (ref 98–111)
Creatinine, Ser: 0.67 mg/dL (ref 0.44–1.00)
GFR, Estimated: >60 mL/min (ref >60)
Glucose, Bld: 108 mg/dL — ABNORMAL HIGH (ref 70–99)
Potassium: 3.8 mmol/L (ref 3.5–5.1)
Sodium: 138 mmol/L (ref 135–145)

## 2024-07-14 LAB — PREGNANCY, URINE: Preg Test, Ur: NEGATIVE

## 2024-07-14 MED ORDER — METHOCARBAMOL 500 MG PO TABS
500.0000 mg | ORAL_TABLET | Freq: Three times a day (TID) | ORAL | 0 refills | Status: AC | PRN
  Filled 2024-07-14: qty 20, 7d supply, fill #0

## 2024-07-14 MED ORDER — HYDROMORPHONE HCL 1 MG/ML IJ SOLN
1.0000 mg | Freq: Once | INTRAMUSCULAR | Status: AC
  Administered 2024-07-14: 1 mg via INTRAVENOUS
  Filled 2024-07-14: qty 1

## 2024-07-14 MED ORDER — DEXAMETHASONE SODIUM PHOSPHATE 10 MG/ML IJ SOLN
10.0000 mg | Freq: Once | INTRAMUSCULAR | Status: AC
  Administered 2024-07-14: 10 mg via INTRAVENOUS
  Filled 2024-07-14: qty 1

## 2024-07-14 MED ORDER — METHYLPREDNISOLONE 4 MG PO TBPK
ORAL_TABLET | ORAL | 0 refills | Status: AC
Start: 2024-07-14 — End: ?
  Filled 2024-07-14: qty 21, 6d supply, fill #0

## 2024-07-14 MED ORDER — GADOBUTROL 1 MMOL/ML IV SOLN
6.0000 mL | Freq: Once | INTRAVENOUS | Status: AC | PRN
  Administered 2024-07-14: 6 mL via INTRAVENOUS

## 2024-07-14 MED ORDER — KETOROLAC TROMETHAMINE 15 MG/ML IJ SOLN
15.0000 mg | Freq: Once | INTRAMUSCULAR | Status: AC
  Administered 2024-07-14: 15 mg via INTRAVENOUS
  Filled 2024-07-14: qty 1

## 2024-07-14 NOTE — ED Notes (Signed)
 Patient transported to MRI

## 2024-07-14 NOTE — Discharge Instructions (Addendum)
 MRI of your back is normal.  Take the steroids and muscle relaxers as prescribed.  Follow-up with your doctor and spine specialist.  Return to the ED for worsening pain, weakness, numbness, tingling, bowel or bladder incontinence or any other concerns.

## 2024-07-14 NOTE — ED Notes (Signed)
 Unable to locate patient at ER , patient left without waiting for her discharge papers.

## 2024-07-14 NOTE — Transitions of Care (Post Inpatient/ED Visit) (Signed)
 07/14/2024  Name: Gina Burton MRN: [id number] DOB: 1971/01/31  Today's TOC FU Call Status:   Patient's Name and Date of Birth confirmed.  Transition Care Management Follow-up Telephone Call Date of Discharge: 07/14/24 Discharge Facility: [facility]) Type of Discharge: Emergency Department How have you been since you were released from the hospital?: Same Any questions or concerns?: No  Items Reviewed: Did you receive and understand the discharge instructions provided?: No Medications obtained,verified, and reconciled?: Yes (Medications Reviewed) Any new allergies since your discharge?: No Dietary orders reviewed?: NA Do you have support at home?: Yes Name of Support/Comfort Primary Source: roommate  Medications Reviewed Today: Medications Reviewed Today     Reviewed by Starlene Charlynn BIRCH, CMA (Certified Medical Assistant) on 07/14/24 at 1418  Med List Status: <None>   Medication Order Taking? Sig Documenting Provider Last Dose Status Informant  albuterol  (VENTOLIN  HFA) 108 (90 Base) MCG/ACT inhaler [id number]  Inhale 2 puffs into the lungs every 6 (six) hours as needed for wheezing or shortness of breath.  Patient not taking: Reported on 03/23/2024   Vicci Barnie NOVAK, MD  Active Self  Artificial Saliva (BIOTENE DRY MOUTH MOIST SPRAY) SOLN [id number] Yes Use as directed 2 sprays in the mouth or throat at bedtime as needed. [name], NP  Active   busPIRone  (BUSPAR ) 15 MG tablet [id number]  Take 1 tablet (15 mg total) by mouth at bedtime.  Patient not taking: Reported on 03/23/2024   McQuilla, Jai B, MD  Active   cloNIDine  (CATAPRES ) 0.1 MG tablet [id number]  Take 1 tablet (0.1 mg total) by mouth 2 (two) times daily.  Patient not taking: Reported on 03/23/2024   McQuilla, Jai B, MD  Active   COVID-19 mRNA vaccine, Moderna, 100 MCG/0.5ML injection [id number]  Inject into the muscle.  Patient not taking: Reported on 01/31/2022   Luiz Channel, MD  Active Self   ferrous sulfate  325 (65 FE) MG tablet [id number]  TAKE 1 TABLET BY MOUTH IN THE MORNING AND AT BEDTIME. Newlin, Enobong, MD  Expired 06/26/23 2359   hydrOXYzine  (ATARAX ) 25 MG tablet [id number]  Take 1 tablet (25 mg total) by mouth 3 (three) times daily as needed.  Patient not taking: Reported on 03/23/2024   Nwoko, Uchenna E, PA  Active   meloxicam  (MOBIC ) 15 MG tablet [id number] Yes Take 1 tablet (15 mg total) by mouth daily. [name], NP  Active   methocarbamol  (ROBAXIN ) 500 MG tablet [id number] Yes Take 1 tablet (500 mg total) by mouth every 8 (eight) hours as needed for muscle spasms. Rancour, Garnette, MD  Active   methylPREDNISolone  (MEDROL  DOSEPAK) 4 MG TBPK tablet [id number] Yes Take As directed Rancour, Garnette, MD  Active   OLANZapine  (ZYPREXA ) 10 MG tablet [id number]  Take 1 tablet (10 mg total) by mouth at bedtime.  Patient not taking: Reported on 03/23/2024   McQuilla, Jai B, MD  Active   OLANZapine  (ZYPREXA ) 5 MG tablet [id number]  Take 1 tablet (5 mg total) by mouth daily in the morning.  Patient not taking: Reported on 03/23/2024   McQuilla, Jai B, MD  Active   ondansetron  (ZOFRAN ) 4 MG tablet [id number]  Take 1 tablet (4 mg total) by mouth every 6 (six) hours.  Patient not taking: Reported on 03/23/2024   Kingsley, Victoria K, DO  Active   traMADol  (ULTRAM ) 50 MG tablet [id number] Yes Take 1 tablet (50 mg total) by mouth every 6 (six) hours as needed for severe pain (pain  score 7-10) or moderate pain (pain score 4-6). Williams, Megan E, NP  Active             Home Care and Equipment/Supplies: Were Home Health Services Ordered?: NA Any new equipment or medical supplies ordered?: NA  Functional Questionnaire: Do you need assistance with bathing/showering or dressing?: No Do you need assistance with meal preparation?: No Do you need assistance with eating?: No Do you have difficulty maintaining continence: No Do you need assistance with getting out of bed/getting out of a  chair/moving?: No Do you have difficulty managing or taking your medications?: No  Follow up appointments reviewed: Specialist Hospital Follow-up appointment confirmed?: NA Do you need transportation to your follow-up appointment?: No Do you understand care options if your condition(s) worsen?: Yes-patient verbalized understanding    SIGNATURE Sriram Febles, RMA

## 2024-07-14 NOTE — ED Provider Notes (Signed)
 [place] EMERGENCY DEPARTMENT AT [facility] Provider Note   CSN: [id number] Arrival date & time: 07/13/24  2023     Patient presents with: Sciatica   Gina Burton is a 53 y.o. female.   Patient with a history of bipolar disorder and asthma here with ongoing right sided back pain but attributes to sciatica.  No recent fall or injury.  States she has had pain intermittently for the past 6 months has been seen as an outpatient for it.  Over the past 5 days she has had constant pain to her right buttock that radiates down her right leg associate with numbness and tingling to the foot.  Did have 1 episode of urinary incontinence yesterday.  Feels weak in her right leg.  There is numbness and tingling going on her right leg as well.  No previous back surgery.  No recent trauma.  She is taking Robaxin  as well as tramadol  at home without relief.  She has been on steroids in the past as well.  Denies any history of cancer.  Denies a history of IV drug abuse.  Denies any fever but states she felt warm yesterday.  Estimates her temperature was 110 but did not check her temperature.  Denies any pain with urination or blood in the urine.  No history of IV drug abuse or cancer.  No chest pain or shortness of breath.  States she is scheduled for an MRI in a couple weeks but was told to come to the ED for pain became too severe.  The history is provided by the patient. The history is limited by a language barrier. A language interpreter was used.       Prior to Admission medications   Medication Sig Start Date End Date Taking? Authorizing Provider  albuterol  (VENTOLIN  HFA) 108 (90 Base) MCG/ACT inhaler Inhale 2 puffs into the lungs every 6 (six) hours as needed for wheezing or shortness of breath. Patient not taking: Reported on 03/23/2024 11/30/21   Vicci Barnie NOVAK, MD  Artificial Saliva (BIOTENE DRY MOUTH MOIST SPRAY) SOLN Use as directed 2 sprays in the mouth or throat at  bedtime as needed. 05/22/24   [name], NP  busPIRone  (BUSPAR ) 15 MG tablet Take 1 tablet (15 mg total) by mouth at bedtime. Patient not taking: Reported on 03/23/2024 12/07/22   McQuilla, Jai B, MD  cloNIDine  (CATAPRES ) 0.1 MG tablet Take 1 tablet (0.1 mg total) by mouth 2 (two) times daily. Patient not taking: Reported on 03/23/2024 12/07/22   McQuilla, Jai B, MD  COVID-19 mRNA vaccine, Moderna, 100 MCG/0.5ML injection Inject into the muscle. Patient not taking: Reported on 01/31/2022 04/19/21   Luiz Channel, MD  ferrous sulfate  325 (65 FE) MG tablet TAKE 1 TABLET BY MOUTH IN THE MORNING AND AT BEDTIME. 06/26/22 06/26/23  Newlin, Enobong, MD  hydrOXYzine  (ATARAX ) 25 MG tablet Take 1 tablet (25 mg total) by mouth 3 (three) times daily as needed. Patient not taking: Reported on 03/23/2024 03/16/22   Nwoko, Uchenna E, PA  meloxicam  (MOBIC ) 15 MG tablet Take 1 tablet (15 mg total) by mouth daily. 06/22/24   [name], NP  methocarbamol  (ROBAXIN ) 500 MG tablet Take 1 tablet (500 mg total) by mouth every 6 (six) hours as needed for muscle spasms. 06/22/24   [name], NP  OLANZapine  (ZYPREXA ) 10 MG tablet Take 1 tablet (10 mg total) by mouth at bedtime. Patient not taking: Reported on 03/23/2024 12/07/22  McQuilla, Jai B, MD  OLANZapine  (ZYPREXA ) 5 MG tablet Take 1 tablet (5 mg total) by mouth daily in the morning. Patient not taking: Reported on 03/23/2024 12/07/22   McQuilla, Jai B, MD  ondansetron  (ZOFRAN ) 4 MG tablet Take 1 tablet (4 mg total) by mouth every 6 (six) hours. Patient not taking: Reported on 03/23/2024 03/09/24   Kingsley, Victoria K, DO  traMADol  (ULTRAM ) 50 MG tablet Take 1 tablet (50 mg total) by mouth every 6 (six) hours as needed for severe pain (pain score 7-10) or moderate pain (pain score 4-6). 06/25/24   Williams, Megan E, NP    Allergies: Seroquel  [quetiapine ]    Review of Systems  Constitutional:  Negative for activity change, appetite change and fever.  HENT:   Negative for congestion and rhinorrhea.   Respiratory:  Negative for cough, chest tightness and shortness of breath.   Cardiovascular:  Negative for chest pain.  Gastrointestinal:  Negative for abdominal pain, nausea and vomiting.  Genitourinary:  Negative for dysuria and hematuria.  Musculoskeletal:  Positive for back pain.  Skin:  Negative for rash.  Neurological:  Positive for weakness and numbness. Negative for headaches.   all other systems are negative except as noted in the HPI and PMH.    Updated Vital Signs BP 104/84 (BP Location: Right Arm)   Pulse 71   Temp 97.8 F (36.6 C) (Temporal)   Resp 16   SpO2 98%   Physical Exam Vitals and nursing note reviewed.  Constitutional:      General: She is not in acute distress.    Appearance: She is well-developed.  HENT:     Head: Normocephalic and atraumatic.     Mouth/Throat:     Pharynx: No oropharyngeal exudate.  Eyes:     Conjunctiva/sclera: Conjunctivae normal.     Pupils: Pupils are equal, round, and reactive to light.  Neck:     Comments: No meningismus. Cardiovascular:     Rate and Rhythm: Normal rate and regular rhythm.     Heart sounds: Normal heart sounds. No murmur heard. Pulmonary:     Effort: Pulmonary effort is normal. No respiratory distress.     Breath sounds: Normal breath sounds.  Abdominal:     Palpations: Abdomen is soft.     Tenderness: There is no abdominal tenderness. There is no guarding or rebound.  Musculoskeletal:        General: Tenderness present. Normal range of motion.     Cervical back: Normal range of motion and neck supple.     Comments: R SI joint tenderness 5/5 strength in LLE, 4/5 in RLE. Ankle plantar and dorsiflexion intact. Great toe extension intact bilaterally. +2 DP and PT pulses. +2 patellar reflexes bilaterally. Normal gait.   Skin:    General: Skin is warm.  Neurological:     Mental Status: She is alert and oriented to person, place, and time.     Cranial Nerves: No  cranial nerve deficit.     Motor: No abnormal muscle tone.     Coordination: Coordination normal.     Comments:  5/5 strength throughout. CN 2-12 intact.Equal grip strength.   Psychiatric:        Behavior: Behavior normal.     (all labs ordered are listed, but only abnormal results are displayed) Labs Reviewed  URINALYSIS, ROUTINE W REFLEX MICROSCOPIC - Abnormal; Notable for the following components:      Result Value   APPearance HAZY (*)    Leukocytes,Ua MODERATE (*)  Bacteria, UA FEW (*)    All other components within normal limits  CBC WITH DIFFERENTIAL/PLATELET - Abnormal; Notable for the following components:   Hemoglobin 11.0 (*)    HCT 35.6 (*)    MCH 24.9 (*)    RDW 17.5 (*)    All other components within normal limits  BASIC METABOLIC PANEL WITH GFR - Abnormal; Notable for the following components:   Glucose, Bld 108 (*)    Calcium  8.6 (*)    All other components within normal limits  PREGNANCY, URINE    EKG: None  Radiology: MR Lumbar Spine W Wo Contrast Result Date: 07/14/2024 CLINICAL DATA:  Myelopathy, acute, lumbar spine Low back pain with incontinence EXAM: MRI LUMBAR SPINE WITHOUT AND WITH CONTRAST TECHNIQUE: Multiplanar and multiecho pulse sequences of the lumbar spine were obtained without and with intravenous contrast. CONTRAST:  6mL GADAVIST  GADOBUTROL  1 MMOL/ML IV SOLN COMPARISON:  None Available. FINDINGS: Alignment:  Normal. Vertebrae: No fracture, evidence of discitis, or suspicious bone lesion. No abnormal enhancement. Conus medullaris and cauda equina: Conus extends to the L1 level. Conus appears normal. Paraspinal and other soft tissues: Negative. Disc levels: T12-L1: No significant disc protrusion, foraminal stenosis, or canal stenosis. L1-L2: No significant disc protrusion, foraminal stenosis, or canal stenosis. L2-L3: No significant disc protrusion, foraminal stenosis, or canal stenosis. L3-L4: Mild disc bulge.  No significant canal or foraminal  stenosis. L4-L5: Mild disc bulge.  No significant canal or foraminal stenosis. L5-S1: No significant disc protrusion, foraminal stenosis, or canal stenosis. IMPRESSION: No significant stenosis. Electronically Signed   By: Gilmore GORMAN Molt M.D.   On: 07/14/2024 02:42     Procedures   Medications Ordered in the ED  HYDROmorphone  (DILAUDID ) injection 1 mg (has no administration in time range)  dexamethasone  (DECADRON ) injection 10 mg (has no administration in time range)  ketorolac  (TORADOL ) 15 MG/ML injection 15 mg (has no administration in time range)                                    Medical Decision Making Amount and/or Complexity of Data Reviewed Labs: ordered. Decision-making details documented in ED Course. Radiology: ordered and independent interpretation performed. Decision-making details documented in ED Course. ECG/medicine tests: ordered and independent interpretation performed. Decision-making details documented in ED Course.  Risk Prescription drug management.   Sciatica pain ongoing for the past 5 days.  Some associated weakness and numbness to the right leg with reported episode of incontinence yesterday.  Subjective fever at home.  Intact distal pulses and reflexes.  However given her report of incontinence will obtain MRI tonight  MRI of the normal.  No evidence of cord compression or cauda equina.  No evidence of significant foraminal stenosis.  Labs reassuring.  No significant leukocytosis.  No fever.  Patient feels improved.  She is able to tolerate p.o. and ambulate.  Will treat supportively with steroids and muscle relaxers.  Follow-up with PCP as well as her spine specialist.     Final diagnoses:  Right sided sciatica    ED Discharge Orders     None          Kimorah Ridolfi, Garnette, MD 07/14/24 0330

## 2024-07-15 ENCOUNTER — Ambulatory Visit

## 2024-07-15 ENCOUNTER — Encounter: Payer: Self-pay | Admitting: Physical Medicine and Rehabilitation

## 2024-07-15 DIAGNOSIS — M6281 Muscle weakness (generalized): Secondary | ICD-10-CM

## 2024-07-15 DIAGNOSIS — M5442 Lumbago with sciatica, left side: Secondary | ICD-10-CM | POA: Diagnosis not present

## 2024-07-15 DIAGNOSIS — M5459 Other low back pain: Secondary | ICD-10-CM | POA: Diagnosis not present

## 2024-07-15 DIAGNOSIS — R2689 Other abnormalities of gait and mobility: Secondary | ICD-10-CM

## 2024-07-15 DIAGNOSIS — G8929 Other chronic pain: Secondary | ICD-10-CM | POA: Diagnosis not present

## 2024-07-15 DIAGNOSIS — M5441 Lumbago with sciatica, right side: Secondary | ICD-10-CM | POA: Diagnosis not present

## 2024-07-15 NOTE — Therapy (Signed)
 OUTPATIENT PHYSICAL THERAPY TREATMENT   Patient Name: Gina Burton MRN: [id number] DOB:1971-06-02, 53 y.o., female Today's Date: 07/16/2024  END OF SESSION:  PT End of Session - 07/15/24 1128     Visit Number 6    Number of Visits 17    Date for PT Re-Evaluation 08/20/24    Authorization Type [employer]    PT Start Time 1140    PT Stop Time 1220    PT Time Calculation (min) 40 min    Activity Tolerance Patient tolerated treatment well    Behavior During Therapy [facility] for tasks assessed/performed              Past Medical History:  Diagnosis Date   Asthma    Bipolar disorder, current episode hypomanic (HCC)    Past Surgical History:  Procedure Laterality Date   arm surgery     CHOLECYSTECTOMY  1997   Patient Active Problem List   Diagnosis Date Noted   Low back pain 06/04/2024   Pseudocyesis 10/08/2022   Fibroid uterus 09/07/2022   DUB (dysfunctional uterine bleeding) 09/07/2022   Grief at loss of child 07/24/2022   Adjustment disorder with depressed mood 02/01/2022   Menorrhagia with irregular cycle 06/13/2021   GAD (generalized anxiety disorder) 03/06/2021   Rib pain 03/06/2021   Psychophysiological insomnia 03/06/2021   Closed fracture of one rib of left side 03/06/2021   Vitamin D  deficiency 03/06/2021   Absolute anemia 03/06/2021   Hypokalemia 03/06/2021   Major depression, chronic 09/15/2020   Bipolar disorder, current episode hypomanic (HCC)    Asthma     PCP: [name], NP  REFERRING PROVIDER: Persons, [name], PA  REFERRING DIAG: G89.29,M54.41,M54.42 (ICD-10-CM) - Chronic bilateral low back pain with sciatica, sciatica laterality unspecified  Rationale for Evaluation and Treatment: Rehabilitation  THERAPY DIAG:  Other low back pain  Muscle weakness (generalized)  Other abnormalities of gait and mobility  ONSET DATE: Chronic  SUBJECTIVE:                                                                                                                                                                                            SUBJECTIVE STATEMENT: Pt presents to PT after ED visit and MRI. She asks about what she got in her IV as she has not felt quite right since, noted some vaginal bleeding when she got home. Is going to ask her PCP about changing up medications to reduce pain, especially wants to ask about gabapentin because friends have told her this has helped him.   EVAL: Pt presents to PT with reports of few  month hx of increasing LBP. Has referral of symptoms as well as N/T down lateral R LE. Denies bowel/bladder changes or saddle anesthesia. Works at [name] and her work shift greatly increases her pain. Denies any positional alleviation.   PERTINENT HISTORY:  asthma, bipolar disorder (hypomanic), GAD, insomnia  PAIN:  Are you having pain?  Yes: NPRS scale: 10/10 Worst: 10/10 Pain location: lower back  Pain description: sharp, numb Aggravating factors: standing, walking, lifting Relieving factors: rest, medication   PRECAUTIONS: None  RED FLAGS: None   WEIGHT BEARING RESTRICTIONS: No  FALLS:  Has patient fallen in last 6 months? No  LIVING ENVIRONMENT: Lives with: lives with their family Lives in: House/apartment  OCCUPATION: [name]  PLOF: Independent  PATIENT GOALS: decrease back and R LE pain, improve comfort with work  OBJECTIVE:  Note: Objective measures were completed at Evaluation unless otherwise noted.  DIAGNOSTIC FINDINGS:  June 2025 Lumbar XR: 2 view radiographs of the lumbar spine demonstrate overall well-maintained  alignment some degenerative changes at L4-5 and L5-S1 and facet  arthropathy   PATIENT SURVEYS:  ODI: 29/50  COGNITION: Overall cognitive status: Within functional limits for tasks assessed     SENSATION: WFL  LUMBAR ROM:   AROM eval  Flexion 25% p!  Extension 25% p!  Right lateral flexion   Left lateral flexion   Right  rotation   Left rotation    (Blank rows = not tested) (Key: WFL = within functional limits not formally assessed, * = concordant pain, s = stiffness/stretching sensation, NT = not tested) Comment:   LOWER EXTREMITY MMT:    MMT Right eval Left eval  Hip flexion 3 3  Hip abduction (modified sitting) 3 3  Hip internal rotation    Hip external rotation    Knee flexion 3+ 3+  Knee extension 3+ 3+  Ankle dorsiflexion     (Blank rows = not tested) (Key: WFL = within functional limits not formally assessed, * = concordant pain, s = stiffness/stretching sensation, NT = not tested)  Comments:    LUMBAR SPECIAL TESTS:   Slump test:   R: (+)   L: (-)   FUNCTIONAL TESTS:  5xSTS/30secSTS: 4 reps   GAIT: Distance walked: within clinic Assistive device utilized: None Level of assistance: Complete Independence Comments: trunk flexed, decreased R stance  TREATMENT DATE:  [facility] Adult PT Treatment:                                                DATE: 07/15/24 Therapeutic Exercise: Swiss ball PPT x 10 - 5 hold Swiss ball lateral rocking x 10 ea Seated swiss ball fwd flexion x 10 - on tbale Self Care/Home Management: Discussed current management of symptoms and what questions to ask PCP, discussion in next steps of POC  [facility] Adult PT Treatment:                                                DATE: 07/13/24 Therapeutic Exercise: Nustep lvl 4 for 3 min for functional activity tolerance LTR x 5 each Supine PPT x 5 - 5 hold Hooklying ball squeeze x 15 Self Care/Home Management: Discussed current management of symptoms, went over address for orthocare and imaging so  she can set up MRI, once again reviewed cauda equina symptoms and discussed pt going to ED Modalities: MHP to lumbar paraspinals during supine exercises  PATIENT EDUCATION:  Education details: Pt education on PT impairments, prognosis, and POC. Informed consent. Rationale for interventions, safe/appropriate HEP  performance Person educated: Patient Education method: Explanation, Demonstration, Tactile cues, Verbal cues Education comprehension: verbalized understanding, returned demonstration, verbal cues required, tactile cues required, and needs further education    HOME EXERCISE PROGRAM: Access Code: [email or web address] URL: [email or web address] Date: 07/15/2024 Prepared by: Alm Kingdom  Exercises - Supine Posterior Pelvic Tilt  - 2 x daily - 7 x weekly - 2 sets - 10 reps - 5 sec hold - Supine Sciatic Nerve Glide  - 2 x daily - 7 x weekly - 2 sets - 10 reps - Supine Double Knee to Chest  - 2 x daily - 7 x weekly - 3 sets - 2 reps - 30 sec hold - Pelvic Tilt on Swiss Ball  - 1 x daily - 7 x weekly - 2 sets - 10 reps - Seated Lateral Pelvic Tilt on Swiss Ball  - 1 x daily - 7 x weekly - 2 sets - 10 reps - Seated Flexion Stretch with Swiss Ball  - 1 x daily - 7 x weekly - 2 sets - 10 reps - 5 sec hold  ASSESSMENT:  CLINICAL IMPRESSION: Pt was able to complete prescribed exercises and showed improved tolerance with swiss ball activity. She had numerous questions about her ED visit and what she wanted to ask PCP at her next visit with her. She continues to think therapy is helpful and we will keep her next visits, but if symptoms do not continue to progress therapeutic benefit is questionable.   EVAL: Patient is a 53 y.o. woman who was seen today for physical therapy evaluation and treatment for back pain. Physical findings are consistent with referring provider impression as pt has positive neural tension findings on RLE, decreased proximal hip strength, and significantly reduced functional mobility. Pt ODI score shows severe disability in performance of home ADLs and community activities. She would benefit from skilled PT services working on improving core and hip strength to reduce pain and neural tension.    OBJECTIVE IMPAIRMENTS: Abnormal gait, decreased activity tolerance, decreased  balance, decreased endurance, decreased mobility, difficulty walking, decreased ROM, decreased strength, and pain.   ACTIVITY LIMITATIONS: carrying, lifting, standing, squatting, stairs, transfers, and locomotion level  PARTICIPATION LIMITATIONS: meal prep, cleaning, driving, shopping, community activity, occupation, yard work, and school  PERSONAL FACTORS: Time since onset of injury/illness/exacerbation and 3+ comorbidities: asthma, bipolar disorder (hypomanic), GAD, insomnia are also affecting patient's functional outcome.   REHAB POTENTIAL: Good  CLINICAL DECISION MAKING: Stable/uncomplicated  EVALUATION COMPLEXITY: Low   GOALS: Goals reviewed with patient? No  SHORT TERM GOALS: Target date: 07/16/2024    Pt will demonstrate appropriate understanding and performance of initially prescribed HEP in order to facilitate improved independence with management of symptoms.  Baseline: HEP given  Goal status: MET   2. Pt will self report low back pain no greater than 7/10 for improved comfort and functional ability Baseline: 10/10 at worst 07/13/2024: 10/10 at worst Goal status: IN PROGRESS   LONG TERM GOALS: Target date: 08/20/2024   Pt will improve at least 20% (19/50) on ODI in order to demonstrate improved perception of functional status due to symptoms.  Baseline: 58% (29/50) Goal status: INITIAL  2.  Pt will demonstrate improved lumbar AROM to at  least 75% in order to demonstrate improved tolerance to functional movement patterns.  Baseline: see ROM chart Goal status: INITIAL  3.  Pt will demonstrate hip MMT of 4/5 in order to demonstrate improved strength for functional movements.  Baseline: see MMT chart Goal status: INITIAL  4. Pt will increase 30 Second Sit to Stand rep count to no less than 9 reps for improved balance, strength, and functional mobility Baseline: 4 reps  Goal status: INITIAL   PLAN:  PT FREQUENCY: 2x/week  PT DURATION: 8 weeks  PLANNED  INTERVENTIONS: 97164- PT Re-evaluation, 97110-Therapeutic exercises, 97530- Therapeutic activity, [id number]- Neuromuscular re-education, 97535- Self Care, [id number]- Manual therapy, [id number]- Electrical stimulation (unattended), [id number]- Electrical stimulation (manual), 20560 (1-2 muscles), 20561 (3+ muscles)- Dry Needling, Cryotherapy, and Moist heat.  PLAN FOR NEXT SESSION: Review/update HEP PRN. Work on [employer] exercises as appropriate with emphasis on neutral spine core strength and proximal hip. Symptom modification strategies as indicated/appropriate.   For all possible CPT codes, reference the Planned Interventions line above.     Check all conditions that are expected to impact treatment: {Conditions expected to impact treatment:Musculoskeletal disorders   If treatment provided at initial evaluation, no treatment charged due to lack of authorization.      Alm JAYSON Kingdom PT  07/16/24 8:06 AM

## 2024-07-17 ENCOUNTER — Encounter (HOSPITAL_COMMUNITY): Payer: Self-pay | Admitting: Physician Assistant

## 2024-07-17 ENCOUNTER — Other Ambulatory Visit: Payer: Self-pay

## 2024-07-17 ENCOUNTER — Ambulatory Visit (INDEPENDENT_AMBULATORY_CARE_PROVIDER_SITE_OTHER): Admitting: Physician Assistant

## 2024-07-17 DIAGNOSIS — F3132 Bipolar disorder, current episode depressed, moderate: Secondary | ICD-10-CM

## 2024-07-17 DIAGNOSIS — F411 Generalized anxiety disorder: Secondary | ICD-10-CM

## 2024-07-17 MED ORDER — OLANZAPINE 10 MG PO TABS
ORAL_TABLET | ORAL | 1 refills | Status: DC
  Filled 2024-07-17: qty 30, 33d supply, fill #0

## 2024-07-17 MED ORDER — BUSPIRONE HCL 7.5 MG PO TABS
7.5000 mg | ORAL_TABLET | Freq: Every day | ORAL | 1 refills | Status: DC
  Filled 2024-07-17: qty 60, 60d supply, fill #0
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
## 2024-07-22 ENCOUNTER — Ambulatory Visit: Payer: Self-pay | Admitting: Nurse Practitioner

## 2024-07-22 ENCOUNTER — Other Ambulatory Visit: Payer: Self-pay

## 2024-07-22 ENCOUNTER — Encounter: Payer: Self-pay | Admitting: Nurse Practitioner

## 2024-07-22 VITALS — BP 120/80 | HR 74 | Wt 147.0 lb

## 2024-07-22 DIAGNOSIS — N939 Abnormal uterine and vaginal bleeding, unspecified: Secondary | ICD-10-CM | POA: Diagnosis not present

## 2024-07-22 DIAGNOSIS — N63 Unspecified lump in unspecified breast: Secondary | ICD-10-CM

## 2024-07-22 DIAGNOSIS — M5431 Sciatica, right side: Secondary | ICD-10-CM

## 2024-07-22 LAB — POCT URINE PREGNANCY: Preg Test, Ur: NEGATIVE

## 2024-07-22 MED ORDER — LIDOCAINE 5 % EX PTCH
1.0000 | MEDICATED_PATCH | CUTANEOUS | 0 refills | Status: AC
  Filled 2024-07-22: qty 30, 30d supply, fill #0

## 2024-07-22 NOTE — Progress Notes (Signed)
 Subjective   Patient ID: Gina Burton, female    DOB: [birth year], 53 y.o.   MRN: [id number]  Chief Complaint  Patient presents with   Hospitalization Follow-up   milk in breast    Patient stated that there is milk in her breast and they are swollen     Referring provider: [name], NP  Gina Burton is a 53 y.o. female with Past Medical History: No date: Asthma No date: Bipolar disorder, current episode hypomanic [facility])   HPI  Patient presents today for an ED follow-up.  Patient was seen in the ED for right-sided sciatica.  She has been participating in physical therapy.  We will place a referral for her to Ortho.  She is also complaining of tenderness and milk production to her breast and irregular periods.  Her pregnancy test in office today was negative.  We will place a referral to OB/GYN. Denies f/c/s, n/v/d, hemoptysis, PND, leg swelling Denies chest pain or edema    Allergies  Allergen Reactions   Seroquel  [Quetiapine ]     Severe akathisia    Immunization History  Administered Date(s) Administered   Influenza,inj,Quad PF,6+ Mos 10/26/2021   Moderna SARS-COV2 Booster Vaccination 04/19/2021   Moderna Sars-Covid-2 Vaccination 08/31/2020, 09/28/2020   PNEUMOCOCCAL CONJUGATE-20 10/26/2021   Tdap 02/15/2016, 11/14/2018    Tobacco History: Social History   Tobacco Use  Smoking Status Never  Smokeless Tobacco Never   Counseling given: Not Answered   Outpatient Encounter Medications as of 07/22/2024  Medication Sig   Artificial Saliva (BIOTENE DRY MOUTH MOIST SPRAY) SOLN Use as directed 2 sprays in the mouth or throat at bedtime as needed.   busPIRone  (BUSPAR ) 7.5 MG tablet Take 1 tablet (7.5 mg total) by mouth at bedtime.   lidocaine  (LIDODERM ) 5 % Place 1 patch onto the skin daily. Remove & Discard patch within 12 hours or as directed by MD   meloxicam  (MOBIC ) 15 MG tablet Take 1 tablet (15 mg total) by mouth daily.    methocarbamol  (ROBAXIN ) 500 MG tablet Take 1 tablet (500 mg total) by mouth every 8 (eight) hours as needed for muscle spasms.   methylPREDNISolone  (MEDROL  DOSEPAK) 4 MG TBPK tablet Take As directed   OLANZapine  (ZYPREXA ) 10 MG tablet Take 0.5 tablets (5 mg total) by mouth at bedtime for 6 days, THEN 1 tablet (10 mg total) at bedtime.   traMADol  (ULTRAM ) 50 MG tablet Take 1 tablet (50 mg total) by mouth every 6 (six) hours as needed for severe pain (pain score 7-10) or moderate pain (pain score 4-6).   albuterol  (VENTOLIN  HFA) 108 (90 Base) MCG/ACT inhaler Inhale 2 puffs into the lungs every 6 (six) hours as needed for wheezing or shortness of breath. (Patient not taking: Reported on 03/23/2024)   cloNIDine  (CATAPRES ) 0.1 MG tablet Take 1 tablet (0.1 mg total) by mouth 2 (two) times daily. (Patient not taking: Reported on 03/23/2024)   COVID-19 mRNA vaccine, Moderna, 100 MCG/0.5ML injection Inject into the muscle. (Patient not taking: Reported on 01/31/2022)   ferrous sulfate  325 (65 FE) MG tablet TAKE 1 TABLET BY MOUTH IN THE MORNING AND AT BEDTIME.   hydrOXYzine  (ATARAX ) 25 MG tablet Take 1 tablet (25 mg total) by mouth 3 (three) times daily as needed. (Patient not taking: Reported on 03/23/2024)   OLANZapine  (ZYPREXA ) 5 MG tablet Take 1 tablet (5 mg total) by mouth daily in the morning. (Patient not taking: Reported on 03/23/2024)   ondansetron  (ZOFRAN ) 4  MG tablet Take 1 tablet (4 mg total) by mouth every 6 (six) hours. (Patient not taking: Reported on 03/23/2024)   No facility-administered encounter medications on file as of 07/22/2024.    Review of Systems  Review of Systems  Constitutional: Negative.   HENT: Negative.    Cardiovascular: Negative.   Gastrointestinal: Negative.   Allergic/Immunologic: Negative.   Neurological: Negative.   Psychiatric/Behavioral: Negative.       Objective:   BP 120/80   Pulse 74   Wt 147 lb (66.7 kg)   SpO2 100%   BMI 25.23 kg/m   Wt Readings from Last 5  Encounters:  07/22/24 147 lb (66.7 kg)  06/22/24 147 lb 12.8 oz (67 kg)  05/22/24 148 lb 6.4 oz (67.3 kg)  03/23/24 147 lb 12.8 oz (67 kg)  03/09/24 149 lb 14.6 oz (68 kg)     Physical Exam Vitals and nursing note reviewed.  Constitutional:      General: She is not in acute distress.    Appearance: She is well-developed.  Cardiovascular:     Rate and Rhythm: Normal rate and regular rhythm.  Pulmonary:     Effort: Pulmonary effort is normal.     Breath sounds: Normal breath sounds.  Neurological:     Mental Status: She is alert and oriented to person, place, and time.       Assessment & Plan:   Swollen breast -     POCT urine pregnancy -     Ambulatory referral to Obstetrics / Gynecology  Right sided sciatica -     Ambulatory referral to Orthopedics -     Lidocaine ; Place 1 patch onto the skin daily. Remove & Discard patch within 12 hours or as directed by MD  Dispense: 30 patch; Refill: 0  Abnormal uterine bleeding -     Ambulatory referral to Obstetrics / Gynecology     Return in about 3 months (around 10/22/2024).   Bascom GORMAN Borer, NP 07/22/2024

## 2024-07-23 ENCOUNTER — Other Ambulatory Visit: Payer: Self-pay

## 2024-07-24 ENCOUNTER — Other Ambulatory Visit: Payer: Self-pay

## 2024-07-24 ENCOUNTER — Telehealth: Payer: Self-pay

## 2024-07-24 NOTE — Telephone Encounter (Signed)
 Pharmacy Patient Advocate Encounter  Received notification from [employer] that Prior Authorization for LIDOCAINE  5% PATCH has been DENIED.  Full denial letter will be uploaded to the media tab. See denial reason below.   PA #/Case ID/Reference #: [id number]  COVERED FOR FOLLOWING DIAGNOSIS: CANCER PAIN AND POSTHERPETIC NEURALGIA  4% LIDOCAIN PATCH AVAILABLE OTC

## 2024-07-27 ENCOUNTER — Ambulatory Visit: Attending: Physician Assistant

## 2024-07-27 ENCOUNTER — Encounter (HOSPITAL_BASED_OUTPATIENT_CLINIC_OR_DEPARTMENT_OTHER): Payer: Self-pay

## 2024-07-27 DIAGNOSIS — R2689 Other abnormalities of gait and mobility: Secondary | ICD-10-CM | POA: Insufficient documentation

## 2024-07-27 DIAGNOSIS — M6281 Muscle weakness (generalized): Secondary | ICD-10-CM | POA: Insufficient documentation

## 2024-07-27 DIAGNOSIS — M5459 Other low back pain: Secondary | ICD-10-CM | POA: Insufficient documentation

## 2024-07-28 ENCOUNTER — Other Ambulatory Visit: Payer: Self-pay

## 2024-07-29 ENCOUNTER — Ambulatory Visit

## 2024-07-29 DIAGNOSIS — R2689 Other abnormalities of gait and mobility: Secondary | ICD-10-CM

## 2024-07-29 DIAGNOSIS — M6281 Muscle weakness (generalized): Secondary | ICD-10-CM | POA: Diagnosis not present

## 2024-07-29 DIAGNOSIS — M5459 Other low back pain: Secondary | ICD-10-CM | POA: Diagnosis not present

## 2024-07-29 NOTE — Therapy (Signed)
 OUTPATIENT PHYSICAL THERAPY TREATMENT   Patient Name: Gina Burton MRN: [id number] DOB:[birth year], 53 y.o., female Today's Date: 07/29/2024  END OF SESSION:  PT End of Session - 07/29/24 1130     Visit Number 7    Number of Visits 17    Date for PT Re-Evaluation 08/20/24    Authorization Type [employer]    PT Start Time 1145    PT Stop Time 1223    PT Time Calculation (min) 38 min    Activity Tolerance Patient tolerated treatment well    Behavior During Therapy [facility] for tasks assessed/performed              Past Medical History:  Diagnosis Date   Asthma    Bipolar disorder, current episode hypomanic (HCC)    Past Surgical History:  Procedure Laterality Date   arm surgery     CHOLECYSTECTOMY  1997   Patient Active Problem List   Diagnosis Date Noted   Bipolar affective disorder, currently depressed, moderate (HCC) 07/17/2024   Low back pain 06/04/2024   Pseudocyesis 10/08/2022   Fibroid uterus 09/07/2022   DUB (dysfunctional uterine bleeding) 09/07/2022   Grief at loss of child 07/24/2022   Adjustment disorder with depressed mood 02/01/2022   Menorrhagia with irregular cycle 06/13/2021   GAD (generalized anxiety disorder) 03/06/2021   Rib pain 03/06/2021   Psychophysiological insomnia 03/06/2021   Closed fracture of one rib of left side 03/06/2021   Vitamin D  deficiency 03/06/2021   Absolute anemia 03/06/2021   Hypokalemia 03/06/2021   Major depression, chronic 09/15/2020   Bipolar disorder, current episode hypomanic (HCC)    Asthma     PCP: [name], NP  REFERRING PROVIDER: Persons, [name], PA  REFERRING DIAG: G89.29,M54.41,M54.42 (ICD-10-CM) - Chronic bilateral low back pain with sciatica, sciatica laterality unspecified  Rationale for Evaluation and Treatment: Rehabilitation  THERAPY DIAG:  Other low back pain  Muscle weakness (generalized)  Other abnormalities of gait and mobility  ONSET DATE:  Chronic  SUBJECTIVE:                                                                                                                                                                                           SUBJECTIVE STATEMENT: Pt presents to PT after ED visit and MRI. She asks about what she got in her IV as she has not felt quite right since, noted some vaginal bleeding when she got home. Is going to ask her PCP about changing up medications to reduce pain, especially wants to ask about gabapentin because friends have told her this has helped him.  EVAL: Pt presents to PT with reports of few month hx of increasing LBP. Has referral of symptoms as well as N/T down lateral R LE. Denies bowel/bladder changes or saddle anesthesia. Works at [name] and her work shift greatly increases her pain. Denies any positional alleviation.   PERTINENT HISTORY:  asthma, bipolar disorder (hypomanic), GAD, insomnia  PAIN:  Are you having pain?  Yes: NPRS scale: 10/10 Worst: 10/10 Pain location: lower back  Pain description: sharp, numb Aggravating factors: standing, walking, lifting Relieving factors: rest, medication   PRECAUTIONS: None  RED FLAGS: None   WEIGHT BEARING RESTRICTIONS: No  FALLS:  Has patient fallen in last 6 months? No  LIVING ENVIRONMENT: Lives with: lives with their family Lives in: House/apartment  OCCUPATION: [name]  PLOF: Independent  PATIENT GOALS: decrease back and R LE pain, improve comfort with work  OBJECTIVE:  Note: Objective measures were completed at Evaluation unless otherwise noted.  DIAGNOSTIC FINDINGS:  June 2025 Lumbar XR: 2 view radiographs of the lumbar spine demonstrate overall well-maintained  alignment some degenerative changes at L4-5 and L5-S1 and facet  arthropathy   PATIENT SURVEYS:  ODI: 29/50  COGNITION: Overall cognitive status: Within functional limits for tasks assessed     SENSATION: WFL  LUMBAR ROM:   AROM eval   Flexion 25% p!  Extension 25% p!  Right lateral flexion   Left lateral flexion   Right rotation   Left rotation    (Blank rows = not tested) (Key: WFL = within functional limits not formally assessed, * = concordant pain, s = stiffness/stretching sensation, NT = not tested) Comment:   LOWER EXTREMITY MMT:    MMT Right eval Left eval  Hip flexion 3 3  Hip abduction (modified sitting) 3 3  Hip internal rotation    Hip external rotation    Knee flexion 3+ 3+  Knee extension 3+ 3+  Ankle dorsiflexion     (Blank rows = not tested) (Key: WFL = within functional limits not formally assessed, * = concordant pain, s = stiffness/stretching sensation, NT = not tested)  Comments:    LUMBAR SPECIAL TESTS:   Slump test:   R: (+)   L: (-)   FUNCTIONAL TESTS:  5xSTS/30secSTS: 4 reps   GAIT: Distance walked: within clinic Assistive device utilized: None Level of assistance: Complete Independence Comments: trunk flexed, decreased R stance  TREATMENT DATE:  [facility] Adult PT Treatment:                                                DATE: 07/29/24 Modified thomas stretch x 30 R Seated PPT on swiss x 10 - 5 hold Assessment of tests/measures, goals, and outcomes Manual Therapy:  LAD R LE grade III S/L R hip ext SIJ mobilization grade II Self Care/Home Management: Discussed very slight LLD on R LE with supine>long sitting test - gave heel wedge, pt reported symptoms improved  [facility] Adult PT Treatment:                                                DATE: 07/15/24 Therapeutic Exercise: Swiss ball PPT x 10 - 5 hold Swiss ball lateral rocking x 10 ea Seated swiss ball fwd flexion x  10 - on tbale Self Care/Home Management: Discussed current management of symptoms and what questions to ask PCP, discussion in next steps of POC  [facility] Adult PT Treatment:                                                DATE: 07/13/24 Therapeutic Exercise: Nustep lvl 4 for 3 min for functional activity  tolerance LTR x 5 each Supine PPT x 5 - 5 hold Hooklying ball squeeze x 15 Self Care/Home Management: Discussed current management of symptoms, went over address for orthocare and imaging so she can set up MRI, once again reviewed cauda equina symptoms and discussed pt going to ED Modalities: MHP to lumbar paraspinals during supine exercises  PATIENT EDUCATION:  Education details: Pt education on PT impairments, prognosis, and POC. Informed consent. Rationale for interventions, safe/appropriate HEP performance Person educated: Patient Education method: Explanation, Demonstration, Tactile cues, Verbal cues Education comprehension: verbalized understanding, returned demonstration, verbal cues required, tactile cues required, and needs further education    HOME EXERCISE PROGRAM: Access Code: [email or web address] URL: [email or web address] Date: 07/29/2024 Prepared by: Alm Kingdom  Exercises - Supine Posterior Pelvic Tilt  - 2 x daily - 7 x weekly - 2 sets - 10 reps - 5 sec hold - Supine Sciatic Nerve Glide  - 2 x daily - 7 x weekly - 2 sets - 10 reps - Supine Double Knee to Chest  - 2 x daily - 7 x weekly - 3 sets - 2 reps - 30 sec hold - Pelvic Tilt on Swiss Ball  - 1 x daily - 7 x weekly - 2 sets - 10 reps - Seated Lateral Pelvic Tilt on Swiss Ball  - 1 x daily - 7 x weekly - 2 sets - 10 reps - Seated Flexion Stretch with Swiss Ball  - 1 x daily - 7 x weekly - 2 sets - 10 reps - 5 sec hold - Standing Piriformis Release with Ball at [employer]  - 1 x daily - 7 x weekly - 2 min hold - Modified Thomas Stretch  - 1 x daily - 7 x weekly - 2 reps - 30 sec hold  ASSESSMENT:  CLINICAL IMPRESSION: Pt tolerated treatment fair, was able to complete all prescribed exercises. Responded well to manual therapy and heel lift, noting decreased R hip and pelvis pain. Unable to rule out SIJ involvement. At this point pt has not made progress with PT. Would like her to see orthopedics again before  proceeding with further care, which she has appointment on 8/21.  EVAL: Patient is a 53 y.o. woman who was seen today for physical therapy evaluation and treatment for back pain. Physical findings are consistent with referring provider impression as pt has positive neural tension findings on RLE, decreased proximal hip strength, and significantly reduced functional mobility. Pt ODI score shows severe disability in performance of home ADLs and community activities. She would benefit from skilled PT services working on improving core and hip strength to reduce pain and neural tension.    OBJECTIVE IMPAIRMENTS: Abnormal gait, decreased activity tolerance, decreased balance, decreased endurance, decreased mobility, difficulty walking, decreased ROM, decreased strength, and pain.   ACTIVITY LIMITATIONS: carrying, lifting, standing, squatting, stairs, transfers, and locomotion level  PARTICIPATION LIMITATIONS: meal prep, cleaning, driving, shopping, community activity, occupation, yard work, and school  PERSONAL FACTORS: Time since  onset of injury/illness/exacerbation and 3+ comorbidities: asthma, bipolar disorder (hypomanic), GAD, insomnia are also affecting patient's functional outcome.   REHAB POTENTIAL: Good  CLINICAL DECISION MAKING: Stable/uncomplicated  EVALUATION COMPLEXITY: Low   GOALS: Goals reviewed with patient? No  SHORT TERM GOALS: Target date: 07/16/2024    Pt will demonstrate appropriate understanding and performance of initially prescribed HEP in order to facilitate improved independence with management of symptoms.  Baseline: HEP given  Goal status: MET   2. Pt will self report low back pain no greater than 7/10 for improved comfort and functional ability Baseline: 10/10 at worst 07/13/2024: 10/10 at worst Goal status: IN PROGRESS   LONG TERM GOALS: Target date: 08/20/2024   Pt will improve at least 20% (19/50) on ODI in order to demonstrate improved perception of  functional status due to symptoms.  Baseline: 58% (29/50) 07/29/2024: 60% (30/50) Goal status: IN PROGRESS  2.  Pt will demonstrate improved lumbar AROM to at least 75% in order to demonstrate improved tolerance to functional movement patterns.  Baseline: see ROM chart Goal status: IN PROGRESS  3.  Pt will demonstrate hip MMT of 4/5 in order to demonstrate improved strength for functional movements.  Baseline: see MMT chart Goal status: IN PROGRESS  4. Pt will increase 30 Second Sit to Stand rep count to no less than 9 reps for improved balance, strength, and functional mobility Baseline: 4 reps 07/29/2024: 7 reps Goal status: IN PROGRESS   PLAN:  PT FREQUENCY: 2x/week  PT DURATION: 8 weeks  PLANNED INTERVENTIONS: 97164- PT Re-evaluation, 97110-Therapeutic exercises, 97530- Therapeutic activity, 97112- Neuromuscular re-education, 97535- Self Care, 02859- Manual therapy, G0283- Electrical stimulation (unattended), [id number]- Electrical stimulation (manual), 20560 (1-2 muscles), 20561 (3+ muscles)- Dry Needling, Cryotherapy, and Moist heat.  PLAN FOR NEXT SESSION: Review/update HEP PRN. Work on [employer] exercises as appropriate with emphasis on neutral spine core strength and proximal hip. Symptom modification strategies as indicated/appropriate.   For all possible CPT codes, reference the Planned Interventions line above.     Check all conditions that are expected to impact treatment: {Conditions expected to impact treatment:Musculoskeletal disorders   If treatment provided at initial evaluation, no treatment charged due to lack of authorization.      Alm JAYSON Kingdom PT  07/29/24 3:08 PM

## 2024-07-30 ENCOUNTER — Other Ambulatory Visit

## 2024-08-03 ENCOUNTER — Ambulatory Visit: Payer: Self-pay | Admitting: Nurse Practitioner

## 2024-08-06 ENCOUNTER — Ambulatory Visit: Admitting: Physical Medicine and Rehabilitation

## 2024-08-06 ENCOUNTER — Encounter: Payer: Self-pay | Admitting: Physical Medicine and Rehabilitation

## 2024-08-06 DIAGNOSIS — M25511 Pain in right shoulder: Secondary | ICD-10-CM | POA: Diagnosis not present

## 2024-08-06 DIAGNOSIS — G8929 Other chronic pain: Secondary | ICD-10-CM

## 2024-08-06 DIAGNOSIS — M546 Pain in thoracic spine: Secondary | ICD-10-CM

## 2024-08-06 DIAGNOSIS — M542 Cervicalgia: Secondary | ICD-10-CM

## 2024-08-06 DIAGNOSIS — M25512 Pain in left shoulder: Secondary | ICD-10-CM

## 2024-08-06 DIAGNOSIS — M79605 Pain in left leg: Secondary | ICD-10-CM | POA: Diagnosis not present

## 2024-08-06 DIAGNOSIS — M79604 Pain in right leg: Secondary | ICD-10-CM | POA: Diagnosis not present

## 2024-08-06 DIAGNOSIS — M5442 Lumbago with sciatica, left side: Secondary | ICD-10-CM

## 2024-08-06 DIAGNOSIS — M7918 Myalgia, other site: Secondary | ICD-10-CM | POA: Diagnosis not present

## 2024-08-06 DIAGNOSIS — M5441 Lumbago with sciatica, right side: Secondary | ICD-10-CM | POA: Diagnosis not present

## 2024-08-06 NOTE — Progress Notes (Signed)
 Gina Burton - 53 y.o. female MRN [id number]  Date of birth: [birth year]  Office Visit Note: Visit Date: 08/06/2024 PCP: [name], NP Referred by: [name], NP  Subjective: Chief Complaint  Patient presents with   Lower Back - Pain    MRI REVIEW - most pain across lower back. Pain does radiate down right leg at times. No medication.    HPI: Gina Burton is a 53 y.o. female who comes in today for evaluation of chronic, worsening and severe bilateral lower back pain radiating down bilateral lateral legs and up her back. Spanish interpreter at bedside. Pain ongoing for several months. Her pain worsens with activity. She complains of pain to neck, shoulders, back and legs. She reports pain to entire body today. She has tried home exercise regimen and Mobic , Robaxin  and Tramadol  with minimal relief of pain. Recently attended formal physical therapy, states these treatments caused increased pain. I did review PT notes, therapist feels she was not making progress and wanted her to follow up with us . Recent lumbar MRI imaging shows no disc herniations, no significant stenosis. She continues to work at [name], states she is having difficulty working due to all over body pain. Patient denies focal weakness, numbness and tingling. No recent trauma or falls   Patients course is complicated by Bipolar Disorder, depression and anxiety.      Review of Systems  Musculoskeletal:  Positive for back pain, joint pain, myalgias and neck pain.  Neurological:  Negative for tingling, sensory change, focal weakness and weakness.  All other systems reviewed and are negative.  Otherwise per HPI.  Assessment & Plan: Visit Diagnoses:    ICD-10-CM   1. Chronic bilateral low back pain with bilateral sciatica  M54.42 Ambulatory referral to Physical Medicine Rehab   M54.41    G89.29     2. Bilateral leg pain  M79.604 Ambulatory referral to Physical Medicine Rehab    M79.605     3. Chronic bilateral thoracic back pain  M54.6 Ambulatory referral to Physical Medicine Rehab   G89.29     4. Cervicalgia  M54.2 Ambulatory referral to Physical Medicine Rehab    5. Chronic pain of both shoulders  M25.511 Ambulatory referral to Physical Medicine Rehab   G89.29    M25.512     6. Myofascial pain syndrome  M79.18 Ambulatory referral to Physical Medicine Rehab       Plan: Findings:  Chronic, worsening and severe all over body pain. She reports discomfort to bilateral lower back radiating to legs and up back. Also pain to shoulders and neck. Patient continues to have severe pain despite good conservative therapies such as formal physical therapy, home exercise regimen, rest and use of medications. Patients clinical presentation and exam are complex. I discussed recent lumbar MRI with her today using imaging and spine model. There are no structural findings on lumbar MRI to explain her symptoms. I do not think interventional spine procedures would be beneficial in alleviating her pain. I did have her complete fibromyalgia screening in the office today. Her widespread pain index and symptom severity scoring does meet diagnostic criteria for fibromyalgia. I do think her symptoms are most consistent with a type of pain syndrome/central sensitization syndrome. We discussed treatment plan, would like to refer her to [facility] for a more comprehensive approach to her pain management such as medications and continued therapy. Could also look at rheumatological labs given her generalized pain.  Patient has no questions at this time. No red flag symptoms noted upon exam today.     Meds & Orders: No orders of the defined types were placed in this encounter.   Orders Placed This Encounter  Procedures   Ambulatory referral to Physical Medicine Rehab    Follow-up: Return if symptoms worsen or fail to improve.   Procedures: No procedures performed       Clinical History: Narrative & Impression CLINICAL DATA:  Myelopathy, acute, lumbar spine Low back pain with incontinence   EXAM: MRI LUMBAR SPINE WITHOUT AND WITH CONTRAST   TECHNIQUE: Multiplanar and multiecho pulse sequences of the lumbar spine were obtained without and with intravenous contrast.   CONTRAST:  6mL GADAVIST  GADOBUTROL  1 MMOL/ML IV SOLN   COMPARISON:  None Available.   FINDINGS: Alignment:  Normal.   Vertebrae: No fracture, evidence of discitis, or suspicious bone lesion. No abnormal enhancement.   Conus medullaris and cauda equina: Conus extends to the L1 level. Conus appears normal.   Paraspinal and other soft tissues: Negative.   Disc levels:   T12-L1: No significant disc protrusion, foraminal stenosis, or canal stenosis.   L1-L2: No significant disc protrusion, foraminal stenosis, or canal stenosis.   L2-L3: No significant disc protrusion, foraminal stenosis, or canal stenosis.   L3-L4: Mild disc bulge.  No significant canal or foraminal stenosis.   L4-L5: Mild disc bulge.  No significant canal or foraminal stenosis.   L5-S1: No significant disc protrusion, foraminal stenosis, or canal stenosis.   IMPRESSION: No significant stenosis.     Electronically Signed   By: Gilmore GORMAN Molt M.D.   On: 07/14/2024 02:42   She reports that she has never smoked. She has never used smokeless tobacco.  Recent Labs    06/22/24 1357  HGBA1C 5.4    Objective:  VS:  HT:    WT:   BMI:     BP:   HR: bpm  TEMP: ( )  RESP:  Physical Exam Vitals and nursing note reviewed.  HENT:     Head: Normocephalic and atraumatic.     Right Ear: External ear normal.     Left Ear: External ear normal.     Nose: Nose normal.     Mouth/Throat:     Mouth: Mucous membranes are moist.  Eyes:     Extraocular Movements: Extraocular movements intact.  Cardiovascular:     Rate and Rhythm: Normal rate.     Pulses: Normal pulses.  Pulmonary:     Effort:  Pulmonary effort is normal.  Abdominal:     General: Abdomen is flat. There is no distension.  Musculoskeletal:        General: Tenderness present.     Cervical back: Tenderness present.     Comments: Patient rises from seated position to standing without difficulty. Good lumbar range of motion. No pain noted with facet loading. 5/5 strength noted with bilateral hip flexion, knee flexion/extension, ankle dorsiflexion/plantarflexion and EHL. No clonus noted bilaterally. No pain upon palpation of greater trochanters. No pain with internal/external rotation of bilateral hips. Sensation intact bilaterally. Myofascial tenderness noted to bilateral lumbar and thoracic paraspinal regions upon palpation. She is particularly tender upon palpation to neck, shoulders, back and legs. Negative slump test bilaterally. Ambulates without aid, gait steady.     Skin:    General: Skin is warm and dry.     Capillary Refill: Capillary refill takes less than 2 seconds.  Neurological:     General: No focal  deficit present.     Mental Status: She is alert and oriented to person, place, and time.  Psychiatric:        Mood and Affect: Mood normal.        Behavior: Behavior normal.     Ortho Exam  Imaging: No results found.  Past Medical/Family/Surgical/Social History: Medications & Allergies reviewed per EMR, new medications updated. Patient Active Problem List   Diagnosis Date Noted   Bipolar affective disorder, currently depressed, moderate (HCC) 07/17/2024   Low back pain 06/04/2024   Pseudocyesis 10/08/2022   Fibroid uterus 09/07/2022   DUB (dysfunctional uterine bleeding) 09/07/2022   Grief at loss of child 07/24/2022   Adjustment disorder with depressed mood 02/01/2022   Menorrhagia with irregular cycle 06/13/2021   GAD (generalized anxiety disorder) 03/06/2021   Rib pain 03/06/2021   Psychophysiological insomnia 03/06/2021   Closed fracture of one rib of left side 03/06/2021   Vitamin D   deficiency 03/06/2021   Absolute anemia 03/06/2021   Hypokalemia 03/06/2021   Major depression, chronic 09/15/2020   Bipolar disorder, current episode hypomanic (HCC)    Asthma    Past Medical History:  Diagnosis Date   Asthma    Bipolar disorder, current episode hypomanic (HCC)    Family History  Problem Relation Age of Onset   Cancer Mother        Possibly Esophageal   Asthma Mother    COPD Mother        smoker   Bipolar disorder Mother        Possibly as well.  Not clear if diagnosed   Seizures Sister    Past Surgical History:  Procedure Laterality Date   arm surgery     CHOLECYSTECTOMY  1997   Social History   Occupational History   Not on file  Tobacco Use   Smoking status: Never   Smokeless tobacco: Never  Substance and Sexual Activity   Alcohol use: No   Drug use: No   Sexual activity: Not Currently

## 2024-08-19 ENCOUNTER — Ambulatory Visit: Admitting: Nurse Practitioner

## 2024-08-19 ENCOUNTER — Encounter: Payer: Self-pay | Admitting: Nurse Practitioner

## 2024-08-19 VITALS — BP 107/74 | HR 72 | Wt 154.6 lb

## 2024-08-19 DIAGNOSIS — N926 Irregular menstruation, unspecified: Secondary | ICD-10-CM

## 2024-08-19 DIAGNOSIS — G8929 Other chronic pain: Secondary | ICD-10-CM | POA: Diagnosis not present

## 2024-08-19 DIAGNOSIS — M5442 Lumbago with sciatica, left side: Secondary | ICD-10-CM

## 2024-08-19 DIAGNOSIS — Z3200 Encounter for pregnancy test, result unknown: Secondary | ICD-10-CM | POA: Diagnosis not present

## 2024-08-19 DIAGNOSIS — M5441 Lumbago with sciatica, right side: Secondary | ICD-10-CM | POA: Diagnosis not present

## 2024-08-19 DIAGNOSIS — N643 Galactorrhea not associated with childbirth: Secondary | ICD-10-CM

## 2024-08-19 DIAGNOSIS — R6889 Other general symptoms and signs: Secondary | ICD-10-CM

## 2024-08-19 LAB — POC COVID19/FLU A&B COMBO
Covid Antigen, POC: NEGATIVE
Influenza A Antigen, POC: NEGATIVE
Influenza B Antigen, POC: NEGATIVE

## 2024-08-19 LAB — POCT URINE PREGNANCY: Preg Test, Ur: NEGATIVE

## 2024-08-19 MED ORDER — KETOROLAC TROMETHAMINE 30 MG/ML IJ SOLN
30.0000 mg | Freq: Once | INTRAMUSCULAR | Status: AC
  Administered 2024-08-19: 30 mg via INTRAMUSCULAR

## 2024-08-19 NOTE — Progress Notes (Signed)
 Subjective   Patient ID: Gina Burton, female    DOB: Nov 25, 1971, 53 y.o.   MRN: [id number]  Chief Complaint  Patient presents with   Galactorrhea   Abdominal Pain    Patient stated that her stomach is growing   Leg Pain    Patient is having leg pain    Referring provider: [name], NP  [name] is a 53 y.o. female with Past Medical History: No date: Asthma No date: Bipolar disorder, current episode hypomanic [facility])   HPI  Patient presents today for an acute visit.  She states that she has been having nausea vomiting and diarrhea since last night.  She states that she is concerned she may be pregnant because she has not had a menstrual cycle in over 1 month.  Her last menstrual cycle was in July.  Pregnancy test in office today was negative.  Patient states that she is producing breastmilk and has gained weight.  We will refer her to OB/GYN for further evaluation.  Patient is still having chronic back and leg pain.  She is requesting a Toradol  injection today. Denies f/c/s, n/v/d, hemoptysis, PND, leg swelling Denies chest pain or edema      Nausea and vomiting since yesterday  Last menstrual cycle - July      Allergies  Allergen Reactions   Seroquel  [Quetiapine ]     Severe akathisia    Immunization History  Administered Date(s) Administered   Influenza,inj,Quad PF,6+ Mos 10/26/2021   Moderna SARS-COV2 Booster Vaccination 04/19/2021   Moderna Sars-Covid-2 Vaccination 08/31/2020, 09/28/2020   PNEUMOCOCCAL CONJUGATE-20 10/26/2021   Tdap 02/15/2016, 11/14/2018    Tobacco History: Social History   Tobacco Use  Smoking Status Never  Smokeless Tobacco Never   Counseling given: Not Answered   Outpatient Encounter Medications as of 08/19/2024  Medication Sig   Artificial Saliva (BIOTENE DRY MOUTH MOIST SPRAY) SOLN Use as directed 2 sprays in the mouth or throat at bedtime as needed.   busPIRone  (BUSPAR ) 7.5 MG tablet  Take 1 tablet (7.5 mg total) by mouth at bedtime.   lidocaine  (LIDODERM ) 5 % Place 1 patch onto the skin daily. Remove & Discard patch within 12 hours or as directed by MD   meloxicam  (MOBIC ) 15 MG tablet Take 1 tablet (15 mg total) by mouth daily.   methocarbamol  (ROBAXIN ) 500 MG tablet Take 1 tablet (500 mg total) by mouth every 8 (eight) hours as needed for muscle spasms.   methylPREDNISolone  (MEDROL  DOSEPAK) 4 MG TBPK tablet Take As directed   OLANZapine  (ZYPREXA ) 10 MG tablet Take 0.5 tablets (5 mg total) by mouth at bedtime for 6 days, THEN 1 tablet (10 mg total) at bedtime.   traMADol  (ULTRAM ) 50 MG tablet Take 1 tablet (50 mg total) by mouth every 6 (six) hours as needed for severe pain (pain score 7-10) or moderate pain (pain score 4-6).   albuterol  (VENTOLIN  HFA) 108 (90 Base) MCG/ACT inhaler Inhale 2 puffs into the lungs every 6 (six) hours as needed for wheezing or shortness of breath. (Patient not taking: Reported on 03/23/2024)   cloNIDine  (CATAPRES ) 0.1 MG tablet Take 1 tablet (0.1 mg total) by mouth 2 (two) times daily. (Patient not taking: Reported on 03/23/2024)   COVID-19 mRNA vaccine, Moderna, 100 MCG/0.5ML injection Inject into the muscle. (Patient not taking: Reported on 01/31/2022)   ferrous sulfate  325 (65 FE) MG tablet TAKE 1 TABLET BY MOUTH IN THE MORNING AND AT BEDTIME.   hydrOXYzine  (  ATARAX ) 25 MG tablet Take 1 tablet (25 mg total) by mouth 3 (three) times daily as needed. (Patient not taking: Reported on 03/23/2024)   OLANZapine  (ZYPREXA ) 5 MG tablet Take 1 tablet (5 mg total) by mouth daily in the morning. (Patient not taking: Reported on 03/23/2024)   ondansetron  (ZOFRAN ) 4 MG tablet Take 1 tablet (4 mg total) by mouth every 6 (six) hours. (Patient not taking: Reported on 03/23/2024)   Facility-Administered Encounter Medications as of 08/19/2024  Medication   ketorolac  (TORADOL ) 30 MG/ML injection 30 mg    Review of Systems  Review of Systems  Constitutional: Negative.    HENT: Negative.    Cardiovascular: Negative.   Gastrointestinal: Negative.   Allergic/Immunologic: Negative.   Neurological: Negative.   Psychiatric/Behavioral: Negative.       Objective:   BP 107/74   Pulse 72   Wt 154 lb 9.6 oz (70.1 kg)   SpO2 97%   BMI 26.54 kg/m   Wt Readings from Last 5 Encounters:  08/19/24 154 lb 9.6 oz (70.1 kg)  07/22/24 147 lb (66.7 kg)  06/22/24 147 lb 12.8 oz (67 kg)  05/22/24 148 lb 6.4 oz (67.3 kg)  03/23/24 147 lb 12.8 oz (67 kg)     Physical Exam Vitals and nursing note reviewed.  Constitutional:      General: She is not in acute distress.    Appearance: She is well-developed.  Cardiovascular:     Rate and Rhythm: Normal rate and regular rhythm.  Pulmonary:     Effort: Pulmonary effort is normal.     Breath sounds: Normal breath sounds.  Neurological:     Mental Status: She is alert and oriented to person, place, and time.       Assessment & Plan:   Flu-like symptoms -     POC Covid19/Flu A&B Antigen  Encounter for pregnancy test, result unknown -     POCT urine pregnancy  Chronic bilateral low back pain with bilateral sciatica -     Ketorolac  Tromethamine   Irregular menstrual cycle  Galactorrhea     Return if symptoms worsen or fail to improve.   Bascom GORMAN Borer, NP 08/19/2024

## 2024-08-28 ENCOUNTER — Other Ambulatory Visit: Payer: Self-pay

## 2024-08-28 ENCOUNTER — Encounter (HOSPITAL_COMMUNITY): Payer: Self-pay | Admitting: Physician Assistant

## 2024-08-28 ENCOUNTER — Ambulatory Visit (INDEPENDENT_AMBULATORY_CARE_PROVIDER_SITE_OTHER): Admitting: Physician Assistant

## 2024-08-28 DIAGNOSIS — F411 Generalized anxiety disorder: Secondary | ICD-10-CM

## 2024-08-28 DIAGNOSIS — F3132 Bipolar disorder, current episode depressed, moderate: Secondary | ICD-10-CM | POA: Diagnosis not present

## 2024-08-28 MED ORDER — OLANZAPINE 10 MG PO TABS
10.0000 mg | ORAL_TABLET | Freq: Every day | ORAL | 1 refills | Status: DC
  Filled 2024-08-28: qty 30, 30d supply, fill #0
  Filled 2024-09-29: qty 30, 30d supply, fill #1

## 2024-08-28 MED ORDER — BUSPIRONE HCL 10 MG PO TABS
10.0000 mg | ORAL_TABLET | Freq: Two times a day (BID) | ORAL | 1 refills | Status: DC
  Filled 2024-08-28: qty 60, 30d supply, fill #0
  Filled 2024-09-29: qty 60, 30d supply, fill #1
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
## 2024-09-29 ENCOUNTER — Other Ambulatory Visit (HOSPITAL_BASED_OUTPATIENT_CLINIC_OR_DEPARTMENT_OTHER): Payer: Self-pay

## 2024-09-29 ENCOUNTER — Other Ambulatory Visit: Payer: Self-pay

## 2024-09-29 ENCOUNTER — Encounter (HOSPITAL_BASED_OUTPATIENT_CLINIC_OR_DEPARTMENT_OTHER): Payer: Self-pay | Admitting: Certified Nurse Midwife

## 2024-09-29 ENCOUNTER — Ambulatory Visit (HOSPITAL_BASED_OUTPATIENT_CLINIC_OR_DEPARTMENT_OTHER)
Admission: RE | Admit: 2024-09-29 | Discharge: 2024-09-29 | Disposition: A | Discharge status: Home / Self Care | Source: Ambulatory Visit | Attending: Certified Nurse Midwife | Admitting: Certified Nurse Midwife

## 2024-09-29 ENCOUNTER — Other Ambulatory Visit (HOSPITAL_COMMUNITY)
Admission: RE | Admit: 2024-09-29 | Discharge: 2024-09-29 | Disposition: A | Discharge status: Home / Self Care | Source: Ambulatory Visit | Attending: Certified Nurse Midwife | Admitting: Certified Nurse Midwife

## 2024-09-29 ENCOUNTER — Ambulatory Visit (HOSPITAL_BASED_OUTPATIENT_CLINIC_OR_DEPARTMENT_OTHER): Admitting: Certified Nurse Midwife

## 2024-09-29 VITALS — BP 110/84 | HR 84 | Wt 154.8 lb

## 2024-09-29 DIAGNOSIS — Z3042 Encounter for surveillance of injectable contraceptive: Secondary | ICD-10-CM | POA: Diagnosis not present

## 2024-09-29 DIAGNOSIS — Z1331 Encounter for screening for depression: Secondary | ICD-10-CM | POA: Diagnosis not present

## 2024-09-29 DIAGNOSIS — N924 Excessive bleeding in the premenopausal period: Secondary | ICD-10-CM | POA: Diagnosis not present

## 2024-09-29 DIAGNOSIS — Z1231 Encounter for screening mammogram for malignant neoplasm of breast: Secondary | ICD-10-CM

## 2024-09-29 DIAGNOSIS — Z01419 Encounter for gynecological examination (general) (routine) without abnormal findings: Secondary | ICD-10-CM

## 2024-09-29 DIAGNOSIS — D219 Benign neoplasm of connective and other soft tissue, unspecified: Secondary | ICD-10-CM

## 2024-09-29 DIAGNOSIS — D259 Leiomyoma of uterus, unspecified: Secondary | ICD-10-CM

## 2024-09-29 DIAGNOSIS — Z124 Encounter for screening for malignant neoplasm of cervix: Secondary | ICD-10-CM

## 2024-09-29 MED ORDER — MEDROXYPROGESTERONE ACETATE 150 MG/ML IM SUSP
150.0000 mg | INTRAMUSCULAR | Status: AC
  Administered 2024-09-29: 150 mg via INTRAMUSCULAR

## 2024-09-29 MED ORDER — FLUOXETINE HCL 20 MG PO CAPS
20.0000 mg | ORAL_CAPSULE | Freq: Every day | ORAL | 0 refills | Status: AC
Start: 2024-09-29 — End: ?
  Filled 2024-09-29 (×2): qty 30, 30d supply, fill #0

## 2024-09-29 NOTE — Progress Notes (Signed)
 53 y.o. [phone] Domestic Partner   Other or two or more races Native [place] or Other Pacific Islander female here for annual exam.  Pt reports periods are regular, monthly and heavy at times.   Patient's last menstrual period was 09/15/2024 (approximate).           Exercising: No.  The patient does not participate in regular exercise at present. Smoker:  yes  Health Maintenance: Pap:  09/07/2022 NILM Neg HPV History of abnormal Pap:  no MMG:  None Colonoscopy:  None BMD:   None Screening Labs: PCP   reports that she has been smoking cigarettes. She has never used smokeless tobacco. She reports current alcohol use. She reports that she does not use drugs.  Past Medical History:  Diagnosis Date   Asthma    Bipolar disorder, current episode hypomanic (HCC)     Past Surgical History:  Procedure Laterality Date   arm surgery     CHOLECYSTECTOMY  1997    Current Outpatient Medications  Medication Sig Dispense Refill   busPIRone  (BUSPAR ) 10 MG tablet Take 1 tablet (10 mg total) by mouth 2 (two) times daily. 60 tablet 1   OLANZapine  (ZYPREXA ) 10 MG tablet Take 1 tablet (10 mg total) by mouth at bedtime. 30 tablet 1   albuterol  (VENTOLIN  HFA) 108 (90 Base) MCG/ACT inhaler Inhale 2 puffs into the lungs every 6 (six) hours as needed for wheezing or shortness of breath. (Patient not taking: Reported on 09/29/2024) 8.5 g 2   Artificial Saliva (BIOTENE DRY MOUTH MOIST SPRAY) SOLN Use as directed 2 sprays in the mouth or throat at bedtime as needed. (Patient not taking: Reported on 09/29/2024) 44.3 mL 2   cloNIDine  (CATAPRES ) 0.1 MG tablet Take 1 tablet (0.1 mg total) by mouth 2 (two) times daily. (Patient not taking: Reported on 09/29/2024) 60 tablet 11   COVID-19 mRNA vaccine, Moderna, 100 MCG/0.5ML injection Inject into the muscle. (Patient not taking: Reported on 09/29/2024) 0.25 mL 0   ferrous sulfate  325 (65 FE) MG tablet TAKE 1 TABLET BY MOUTH IN THE MORNING AND AT BEDTIME.  (Patient not taking: Reported on 09/29/2024) 60 tablet 2   hydrOXYzine  (ATARAX ) 25 MG tablet Take 1 tablet (25 mg total) by mouth 3 (three) times daily as needed. (Patient not taking: Reported on 09/29/2024) 75 tablet 1   lidocaine  (LIDODERM ) 5 % Place 1 patch onto the skin daily. Remove & Discard patch within 12 hours or as directed by MD (Patient not taking: Reported on 09/29/2024) 30 patch 0   meloxicam  (MOBIC ) 15 MG tablet Take 1 tablet (15 mg total) by mouth daily. (Patient not taking: Reported on 09/29/2024) 30 tablet 0   methocarbamol  (ROBAXIN ) 500 MG tablet Take 1 tablet (500 mg total) by mouth every 8 (eight) hours as needed for muscle spasms. (Patient not taking: Reported on 09/29/2024) 20 tablet 0   methylPREDNISolone  (MEDROL  DOSEPAK) 4 MG TBPK tablet Take As directed (Patient not taking: Reported on 09/29/2024) 21 each 0   ondansetron  (ZOFRAN ) 4 MG tablet Take 1 tablet (4 mg total) by mouth every 6 (six) hours. (Patient not taking: Reported on 09/29/2024) 12 tablet 0   traMADol  (ULTRAM ) 50 MG tablet Take 1 tablet (50 mg total) by mouth every 6 (six) hours as needed for severe pain (pain score 7-10) or moderate pain (pain score 4-6). (Patient not taking: Reported on 09/29/2024) 20 tablet 0   No current facility-administered medications for this visit.    Family History  Problem Relation  Age of Onset   Cancer Mother        Possibly Esophageal   Asthma Mother    COPD Mother        smoker   Bipolar disorder Mother        Possibly as well.  Not clear if diagnosed   Seizures Sister     ROS: Constitutional: negative Genitourinary:negative  Exam:   BP 110/84 (BP Location: Left Arm, Patient Position: Sitting, Cuff Size: Normal)   Pulse 84   Wt 154 lb 12.8 oz (70.2 kg)   LMP 09/15/2024 (Approximate)   SpO2 100%   BMI 26.57 kg/m      General appearance: alert, cooperative and appears stated age Head: Normocephalic, without obvious abnormality, atraumatic Neck: no adenopathy,  supple, symmetrical, trachea midline  Lungs: clear to auscultation bilaterally Breasts: normal appearance, no masses or tenderness, Inspection negative, No nipple retraction or dimpling, No nipple discharge or bleeding, No axillary or supraclavicular adenopathy, Normal to palpation without dominant masses Heart: regular rate and rhythm Abdomen: soft, non-tender; bowel sounds normal; no masses,  no organomegaly Extremities: extremities normal, atraumatic, no cyanosis or edema Skin: Skin color, texture, turgor normal. No rashes or lesions Lymph nodes: Cervical, supraclavicular, and axillary nodes normal. No abnormal inguinal nodes palpated Neurologic: Grossly normal   Pelvic: External genitalia:  no lesions              Urethra:  normal appearing urethra with no masses, tenderness or lesions              Bartholins and Skenes: normal                 Vagina: normal appearing vagina with normal color and no discharge, no lesions              Cervix: multiparous appearance              Pap taken: Yes.   Bimanual Exam:  Uterus:  normal size, contour, position, consistency, mobility, non-tender              Adnexa: no mass, fullness, tenderness               Rectovaginal: Confirms               Anus:  normal sphincter tone, no lesions  Chaperone,  CMA, was present for exam.  Assessment/Plan:  1. Encounter for annual routine gynecological examination (Primary) - Pt desires routine STI screening - Annual screening mammograms encouraged  2. Excessive bleeding in premenopausal period - CBC - TSH - FSH - medroxyPROGESTERone (DEPO-PROVERA) injection 150 mg  3. Fibroids - US  PELVIC COMPLETE WITH TRANSVAGINAL; Future - CBC - TSH - FSH - medroxyPROGESTERone (DEPO-PROVERA) injection 150 mg  4. Screening mammogram for breast cancer - MM 3D SCREENING MAMMOGRAM BILATERAL BREAST; Future  5. Cervical cancer screening - Cytology - PAP( [place])   RTO 1year for annual gyn exam and prn  if issues arise. Arland MARLA Roller

## 2024-09-30 LAB — CBC
Hematocrit: 38.1 % (ref 34.0–46.6)
Hemoglobin: 11.5 g/dL (ref 11.1–15.9)
MCH: 26 pg — ABNORMAL LOW (ref 26.6–33.0)
MCHC: 30.2 g/dL — ABNORMAL LOW (ref 31.5–35.7)
MCV: 86 fL (ref 79–97)
Platelets: 293 x10E3/uL (ref 150–450)
RBC: 4.43 x10E6/uL (ref 3.77–5.28)
RDW: 16 % — ABNORMAL HIGH (ref 11.7–15.4)
WBC: 8.8 x10E3/uL (ref 3.4–10.8)

## 2024-09-30 LAB — FOLLICLE STIMULATING HORMONE: FSH: 31.2 m[IU]/mL

## 2024-09-30 LAB — TSH: TSH: 1.25 u[IU]/mL (ref 0.450–4.500)

## 2024-10-02 ENCOUNTER — Ambulatory Visit (HOSPITAL_BASED_OUTPATIENT_CLINIC_OR_DEPARTMENT_OTHER): Payer: Self-pay | Admitting: Certified Nurse Midwife

## 2024-10-02 LAB — CYTOLOGY - PAP
Comment: NEGATIVE
Diagnosis: NEGATIVE
High risk HPV: NEGATIVE

## 2024-10-02 MED ORDER — METRONIDAZOLE 500 MG PO TABS: 500.0000 mg | ORAL_TABLET | Freq: Two times a day (BID) | ORAL | 3 refills | Status: DC

## 2024-10-05 ENCOUNTER — Other Ambulatory Visit: Payer: Self-pay

## 2024-10-05 ENCOUNTER — Other Ambulatory Visit (HOSPITAL_BASED_OUTPATIENT_CLINIC_OR_DEPARTMENT_OTHER): Payer: Self-pay

## 2024-10-05 MED ORDER — METRONIDAZOLE 500 MG PO TABS
500.0000 mg | ORAL_TABLET | Freq: Two times a day (BID) | ORAL | 3 refills | Status: AC
  Filled 2024-10-05 (×2): qty 14, 7d supply, fill #0
  Filled ????-??-??: fill #0

## 2024-10-05 NOTE — Progress Notes (Signed)
 Patient wanted her medication called into [facility]. Medication sent. tbw

## 2024-10-05 NOTE — Progress Notes (Signed)
 Called patient about lab results and recommendations. Interpreter used. Rand [phone]) Patient wanted medication sent to [facility]. tbw

## 2024-10-19 ENCOUNTER — Encounter: Payer: Self-pay | Admitting: Radiology

## 2024-10-19 ENCOUNTER — Inpatient Hospital Stay (HOSPITAL_BASED_OUTPATIENT_CLINIC_OR_DEPARTMENT_OTHER): Admission: RE | Admit: 2024-10-19 | Source: Ambulatory Visit | Admitting: Radiology

## 2024-10-21 ENCOUNTER — Ambulatory Visit (INDEPENDENT_AMBULATORY_CARE_PROVIDER_SITE_OTHER): Admitting: Physician Assistant

## 2024-10-21 ENCOUNTER — Other Ambulatory Visit: Payer: Self-pay

## 2024-10-21 DIAGNOSIS — F3132 Bipolar disorder, current episode depressed, moderate: Secondary | ICD-10-CM | POA: Diagnosis not present

## 2024-10-21 DIAGNOSIS — F411 Generalized anxiety disorder: Secondary | ICD-10-CM | POA: Diagnosis not present

## 2024-10-21 MED ORDER — LAMOTRIGINE 25 MG PO TABS
25.0000 mg | ORAL_TABLET | Freq: Every day | ORAL | 1 refills | Status: DC
  Filled 2024-10-21 – 2024-11-11 (×2): qty 30, 30d supply, fill #0

## 2024-10-21 MED ORDER — BUSPIRONE HCL 7.5 MG PO TABS
7.5000 mg | ORAL_TABLET | Freq: Two times a day (BID) | ORAL | 1 refills | Status: DC
  Filled 2024-10-21 – 2024-11-11 (×2): qty 60, 30d supply, fill #0

## 2024-10-21 MED ORDER — OLANZAPINE 5 MG PO TABS
5.0000 mg | ORAL_TABLET | Freq: Every day | ORAL | 1 refills | Status: DC
  Filled 2024-10-21 – 2024-11-11 (×2): qty 30, 30d supply, fill #0
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
## 2024-10-24 ENCOUNTER — Encounter (HOSPITAL_COMMUNITY): Payer: Self-pay | Admitting: Physician Assistant

## 2024-10-28 ENCOUNTER — Other Ambulatory Visit: Payer: Self-pay

## 2024-10-29 ENCOUNTER — Other Ambulatory Visit: Payer: Self-pay

## 2024-11-02 ENCOUNTER — Ambulatory Visit (HOSPITAL_BASED_OUTPATIENT_CLINIC_OR_DEPARTMENT_OTHER): Admitting: Certified Nurse Midwife

## 2024-11-02 ENCOUNTER — Other Ambulatory Visit (HOSPITAL_COMMUNITY)
Admission: RE | Admit: 2024-11-02 | Discharge: 2024-11-02 | Disposition: A | Discharge status: Home / Self Care | Source: Ambulatory Visit | Attending: Certified Nurse Midwife | Admitting: Certified Nurse Midwife

## 2024-11-02 ENCOUNTER — Encounter (HOSPITAL_BASED_OUTPATIENT_CLINIC_OR_DEPARTMENT_OTHER): Payer: Self-pay | Admitting: Certified Nurse Midwife

## 2024-11-02 VITALS — BP 126/87 | HR 75 | Ht 64.0 in | Wt 147.4 lb

## 2024-11-02 DIAGNOSIS — D219 Benign neoplasm of connective and other soft tissue, unspecified: Secondary | ICD-10-CM | POA: Insufficient documentation

## 2024-11-02 DIAGNOSIS — N939 Abnormal uterine and vaginal bleeding, unspecified: Secondary | ICD-10-CM | POA: Diagnosis not present

## 2024-11-02 DIAGNOSIS — Z8619 Personal history of other infectious and parasitic diseases: Secondary | ICD-10-CM | POA: Insufficient documentation

## 2024-11-02 DIAGNOSIS — N898 Other specified noninflammatory disorders of vagina: Secondary | ICD-10-CM | POA: Insufficient documentation

## 2024-11-02 DIAGNOSIS — R9389 Abnormal findings on diagnostic imaging of other specified body structures: Secondary | ICD-10-CM | POA: Insufficient documentation

## 2024-11-02 DIAGNOSIS — N840 Polyp of corpus uteri: Secondary | ICD-10-CM | POA: Insufficient documentation

## 2024-11-02 DIAGNOSIS — N858 Other specified noninflammatory disorders of uterus: Secondary | ICD-10-CM | POA: Diagnosis not present

## 2024-11-02 NOTE — Progress Notes (Signed)
 Subjective:     Gina Burton is a 53 y.o. female here for endometrial biopsy.  Visit completed 100% with aid of bedside Spanish interpreter. Ultrasound (10/03/24) showed enlarged heterogeneous uterus with multiple fibroids with the largest on the anterior uterine wall measuring 5.8 x 6.0 x 5.9cm. Endometrial thickness 11mm. There is a 6 x 6 x 7 mm hyperechoic rounded focus within the fundal portion of the endometrium, which could reflect a small polyp. Normal appearance of both ovaries. Pt is scheduled for Sonohysterogram with Dr. Cleotilde in 2 weeks.  Pt returns today for endometrial sampling due to endometrial lining 11mm. Today the patient states she has experienced vaginal bleeding every day for years. Her most recent Hemoglobin was normal  on 09/29/24.  Pt received 1st dose of Depo Provera on 09/29/24 but states she has not noticed any difference in the vaginal bleeding.  Pt desires routine STD Screening (tested positive for Trichomonas 10/25). Desires GC/CT/TV.   Review of Systems Pertinent items are noted in HPI.    Objective:    BP 126/87   Pulse 75   Ht 5' 4 (1.626 m) Comment: Reported  Wt 147 lb 6.4 oz (66.9 kg)   BMI 25.30 kg/m  General appearance: alert, cooperative, and appears stated age Abdomen: Non-tender Pelvic: cervix normal in appearance, external genitalia normal, no adnexal masses or tenderness, no cervical motion tenderness, and uterus enlarged (known uterine fibroids). Pt with small amount vaginal bleeding.    Assessment:   Abnormal GYN Ultrasound (probable uterine polyp/uterine fibroids)  Uterine Fibroids Thickened endometrial lining Encounter for endometrial biopsy.  Hx Trichomonas infection   Plan:   Routine STD Screening collected (cervicovaginal swab) Pt will RTO 11/25/24 for Sonohysterogram with Dr. Cleotilde. Arland POUR Shelisha Gautier    ENDOMETRIAL BIOPSY     The indications for endometrial biopsy were reviewed.   Risks of the biopsy including  cramping, bleeding, infection, uterine perforation, inadequate specimen and need for additional procedures  were discussed. The patient states she understands and agrees to undergo procedure today. Consent was signed. Time out was performed. Urine HCG was negative. A sterile speculum was placed in the patient's vagina and the cervix was prepped with Betadine. A single-toothed tenaculum was placed on the anterior lip of the cervix to stabilize it. The 3 mm pipelle was introduced into the endometrial cavity without difficulty to a depth of 9 cm, and a moderate amount of tissue was obtained and sent to pathology. The instruments were removed from the patient's vagina. Minimal bleeding from the cervix was noted. The patient tolerated the procedure well. Routine post-procedure instructions were given to the patient. The patient has an appointment in this office in 2 weeks for Sonohysterogram and consult with Dr. Cleotilde.  Arland POUR Roller

## 2024-11-03 ENCOUNTER — Other Ambulatory Visit: Payer: Self-pay

## 2024-11-03 LAB — CERVICOVAGINAL ANCILLARY ONLY
Chlamydia: NEGATIVE
Comment: NEGATIVE
Comment: NEGATIVE
Comment: NORMAL
Neisseria Gonorrhea: NEGATIVE
Trichomonas: NEGATIVE

## 2024-11-04 ENCOUNTER — Other Ambulatory Visit: Payer: Self-pay

## 2024-11-04 ENCOUNTER — Ambulatory Visit (HOSPITAL_BASED_OUTPATIENT_CLINIC_OR_DEPARTMENT_OTHER): Payer: Self-pay | Admitting: Certified Nurse Midwife

## 2024-11-04 LAB — SURGICAL PATHOLOGY

## 2024-11-11 ENCOUNTER — Other Ambulatory Visit: Payer: Self-pay

## 2024-11-25 ENCOUNTER — Other Ambulatory Visit (HOSPITAL_BASED_OUTPATIENT_CLINIC_OR_DEPARTMENT_OTHER)

## 2024-11-25 ENCOUNTER — Other Ambulatory Visit (HOSPITAL_BASED_OUTPATIENT_CLINIC_OR_DEPARTMENT_OTHER): Payer: Self-pay | Admitting: Obstetrics & Gynecology

## 2024-11-25 ENCOUNTER — Encounter (HOSPITAL_BASED_OUTPATIENT_CLINIC_OR_DEPARTMENT_OTHER): Payer: Self-pay | Admitting: Obstetrics & Gynecology

## 2024-11-25 ENCOUNTER — Ambulatory Visit (HOSPITAL_BASED_OUTPATIENT_CLINIC_OR_DEPARTMENT_OTHER): Admitting: Obstetrics & Gynecology

## 2024-11-25 ENCOUNTER — Encounter (HOSPITAL_BASED_OUTPATIENT_CLINIC_OR_DEPARTMENT_OTHER): Payer: Self-pay | Admitting: Physician Assistant

## 2024-11-25 ENCOUNTER — Other Ambulatory Visit: Payer: Self-pay

## 2024-11-25 VITALS — BP 122/93 | HR 89

## 2024-11-25 DIAGNOSIS — Z8619 Personal history of other infectious and parasitic diseases: Secondary | ICD-10-CM | POA: Diagnosis not present

## 2024-11-25 DIAGNOSIS — L900 Lichen sclerosus et atrophicus: Secondary | ICD-10-CM

## 2024-11-25 DIAGNOSIS — D251 Intramural leiomyoma of uterus: Secondary | ICD-10-CM

## 2024-11-25 DIAGNOSIS — N939 Abnormal uterine and vaginal bleeding, unspecified: Secondary | ICD-10-CM | POA: Diagnosis not present

## 2024-11-25 DIAGNOSIS — L292 Pruritus vulvae: Secondary | ICD-10-CM

## 2024-11-25 DIAGNOSIS — D250 Submucous leiomyoma of uterus: Secondary | ICD-10-CM

## 2024-11-25 DIAGNOSIS — N840 Polyp of corpus uteri: Secondary | ICD-10-CM

## 2024-11-25 DIAGNOSIS — N898 Other specified noninflammatory disorders of vagina: Secondary | ICD-10-CM

## 2024-11-25 DIAGNOSIS — D500 Iron deficiency anemia secondary to blood loss (chronic): Secondary | ICD-10-CM | POA: Diagnosis not present

## 2024-11-25 MED ORDER — CLOBETASOL PROPIONATE 0.05 % EX OINT
1.0000 | TOPICAL_OINTMENT | Freq: Two times a day (BID) | CUTANEOUS | 1 refills | Status: DC
  Filled 2024-11-25: qty 60, 30d supply, fill #0

## 2024-11-25 NOTE — Progress Notes (Signed)
 Ultrasound f/u Patient name: Gina Burton MRN [id number]  Date of birth: 1971-08-30 Chief Complaint:   Follow-up  History of Present Illness:   Gina Burton is a 53 y.o. [phone] Hispanic female being seen today for discussion of ultrasound findings.  She has hx of heavy, irregular bleeding.  Was seen on 10/14.  Trichomonas was diagnosed at that visit and was treated.  She was sent for ultrasound that showed uterus measuring 12.4 x 7.7 x 9.8 cm.  There are multiple fibroids present with the largest in the anterior uterine wall measuring 5.8 x 6.0 x 5.9 cm.  The endometrium measured 11 mm and there was a questionable 6 x 6 x 7 mm hyperechoic rounded focus in the fundal portion.  Some question about whether this was a polyp and sonohysterogram was recommended.  Patient return for endometrial biopsy and repeat vaginitis testing on November the 17th.  GC, chlamydia and trichomonas were negative.  Endometrial biopsy showed mucinous metaplasia.  No abnormal cells were noted however this does have an association with endometrial hyperplasia as well as low-grade endometrioid adenocarcinoma.  Patient is most interested in hysterectomy as she is ready for definitive treatment.  She is also very concerned about some continued vulvar irritation and itching that has been ongoing for her.  Sonohysterogram was performed today and there is no intracavitary lesion noted.  The endometrium was symmetric.  However there is a 7.7 x 7.2 cm fibroid that is pushing into the endometrial cavity and has a submucosal component.  I suspect this is contributing significant to her bleeding.  Discussed findings and she is most interested in definitive management.  No LMP recorded. Patient is premenopausal.  Last pap 09/29/2024. Results were: NILM w/ HRHPV negative.   Review of Systems:   Pertinent items are noted in HPI Denies any urinary or bowel changes or pelvic pain Pertinent History Reviewed:   Reviewed past medical,surgical, social and family history.  Reviewed problem list, medications and allergies. Physical Assessment:   Vitals:   11/25/24 1434  BP: (!) 122/93  Pulse: 89  SpO2: 98%  There is no height or weight on file to calculate BMI.        Physical Examination:   General appearance - well appearing, and in no distress  Mental status - alert, oriented to person, place, and time  Psych:  She has a normal mood and affect   Abdomen - soft, nontender, nondistended, no masses or organomegaly  Pelvic - VULVA: Thickened hypopigmented area on the inner labia majora up towards the clitoral region.  This is the area that patient is describing is itchy.  There is thickness on the left inner side that is not present on the white right side.  Vulvar biopsy was recommended but the patient [name] doing this today. VAGINA: normal appearing vagina with normal color and discharge, no lesions   CERVIX: normal appearing cervix without discharge or lesions, no CMT  Thin prep pap is not indicated  UTERUS: Uterus is enlarged in about 14 weeks.  There is more fullness anteriorly toward the bladder.  ADNEXA: No adnexal masses or tenderness noted.  Rectal - normal rectal, good sphincter tone, no masses felt.  Assessment & Plan:  1. Abnormal uterine bleeding (AUB) (Primary) - will move towards definitive management - given prior biopsy results, will reach out to gyn/onc for input  2. Intramural and submucous leiomyoma of uterus  3. History of trichomoniasis - TOC has been completed and is  negative  4. Vulvar itching (exam findings c/w lichen sclerosus) - clobetasol  ointment (TEMOVATE ) 0.05 %; Apply 1 Application topically 2 (two) times daily.  Dispense: 60 g; Refill: 1 - recheck 1 month.  If not significant change in the left labial lesion, will need biopsy as well  5. Iron  deficiency anemia due to chronic blood loss - on oral iron   Spanish interpreter present for entire  conversation  No orders of the defined types were placed in this encounter.   Meds:  Meds ordered this encounter  Medications   clobetasol  ointment (TEMOVATE ) 0.05 %    Sig: Apply 1 Application topically 2 (two) times daily.    Dispense:  60 g    Refill:  1    Follow-up: No follow-ups on file.  Ronal GORMAN Pinal, MD 11/30/2024 4:37 PM GYNECOLOGY  VISIT

## 2024-11-26 ENCOUNTER — Telehealth (HOSPITAL_BASED_OUTPATIENT_CLINIC_OR_DEPARTMENT_OTHER): Payer: Self-pay

## 2024-11-26 ENCOUNTER — Other Ambulatory Visit: Payer: Self-pay

## 2024-11-26 NOTE — Telephone Encounter (Signed)
 Patient would like another rx called in her insurance will not pay for clobetasol  ointment (TEMOVATE ) 0.05 % .  Pt would like for someone to contact her when something else is called in.

## 2024-11-26 NOTE — Telephone Encounter (Signed)
 Kaitlyn, RN, submitted prior authorization for medication today, 11/26/2024. Will call patient once response is received.   Morna LOISE Quale, RN

## 2024-11-27 ENCOUNTER — Other Ambulatory Visit: Payer: Self-pay

## 2024-11-27 NOTE — Telephone Encounter (Signed)
 Prior authorization declined for clobetasol  propionate ointment. Patient would like an alternative medication called into her pharmacy.

## 2024-11-30 ENCOUNTER — Other Ambulatory Visit: Payer: Self-pay

## 2024-11-30 MED ORDER — MOMETASONE FUROATE 0.1 % EX OINT
TOPICAL_OINTMENT | Freq: Two times a day (BID) | CUTANEOUS | 1 refills | Status: AC
  Filled 2024-11-30: qty 45, 30d supply, fill #0

## 2024-12-01 ENCOUNTER — Telehealth (HOSPITAL_BASED_OUTPATIENT_CLINIC_OR_DEPARTMENT_OTHER): Payer: Self-pay

## 2024-12-01 NOTE — Telephone Encounter (Signed)
 Left message via interpreter services (ID [phone]) advised that a new prescription has been sent to her pharmacy on file. If she has any difficulty getting the new prescription or has any questions to call Maresha Anastos at [phone].

## 2024-12-01 NOTE — Telephone Encounter (Signed)
 Duplicate

## 2024-12-02 ENCOUNTER — Encounter (HOSPITAL_COMMUNITY): Payer: Self-pay | Admitting: Physician Assistant

## 2024-12-02 ENCOUNTER — Other Ambulatory Visit: Payer: Self-pay

## 2024-12-02 ENCOUNTER — Ambulatory Visit (HOSPITAL_COMMUNITY): Admitting: Physician Assistant

## 2024-12-02 VITALS — BP 108/78 | HR 73 | Temp 97.3°F | Ht 64.0 in | Wt 147.8 lb

## 2024-12-02 DIAGNOSIS — F411 Generalized anxiety disorder: Secondary | ICD-10-CM

## 2024-12-02 DIAGNOSIS — F3132 Bipolar disorder, current episode depressed, moderate: Secondary | ICD-10-CM

## 2024-12-02 DIAGNOSIS — G479 Sleep disorder, unspecified: Secondary | ICD-10-CM

## 2024-12-02 MED ORDER — LAMOTRIGINE 25 MG PO TABS
25.0000 mg | ORAL_TABLET | Freq: Every day | ORAL | 1 refills | Status: DC
  Filled 2024-12-02 – 2024-12-23 (×2): qty 30, 30d supply, fill #0

## 2024-12-02 MED ORDER — OLANZAPINE 7.5 MG PO TABS
7.5000 mg | ORAL_TABLET | Freq: Every day | ORAL | 1 refills | Status: DC
  Filled 2024-12-02: qty 30, 30d supply, fill #0

## 2024-12-02 MED ORDER — BUSPIRONE HCL 10 MG PO TABS
10.0000 mg | ORAL_TABLET | Freq: Two times a day (BID) | ORAL | 1 refills | Status: DC
  Filled 2024-12-02: qty 60, 30d supply, fill #0

## 2024-12-02 MED ORDER — TRAZODONE HCL 50 MG PO TABS
50.0000 mg | ORAL_TABLET | Freq: Every evening | ORAL | 1 refills | Status: DC | PRN
  Filled 2024-12-02: qty 30, 30d supply, fill #0

## 2024-12-02 NOTE — Progress Notes (Signed)
 BH MD/PA/NP OP Progress Note  12/02/2024 1:00 PM Gina Burton  MRN:  [id number]  Chief Complaint:  Chief Complaint  Patient presents with   Follow-up   Medication Management   HPI:   Gina Burton is a 53 year old female with a past psychiatric history significant for generalized anxiety disorder and bipolar affective disorder (currently depressed, moderate) who presents to [facility] for follow-up and medication management.  Due to patient being Spanish-speaking, Spanish interpretive services were utilized during the duration of the encounter.  Patient is currently being managed on the following psychiatric medications:  Olanzapine  5 mg at bedtime Buspirone  7.5 mg 2 times daily Lamotrigine  25 mg daily  Patient presents to the encounter stating that December has been a hard month for her.  She reports that the 26th of this month will be the 3-year anniversary of her daughter's passing.  Though patient endorses sadness surrounding her deceased daughter, she reports that a friend that was her daughter's husband's friend, is helping her to pay rent and is also lending their car to her.  She believes that this is God helping her out during her hardships.  In regards to her medications, patient reports that her medications have been working well.  She reports that she does experience blurred vision as well as headache but is unsure if her medication is the cause of this.  She also reports that she is requiring surgery in February stating that her uterus is being removed due to possible cancer in her pelvis.  Patient reports that she is stressed out about the situation and states that she will spend a month in the hospital as a result.  Patient reports that her sleep has been disturbed stating that she has been waking up at 2/3 in the morning even though she goes to bed early.  Patient would like to sleep more to forget about her  current stressors.  Patient continues to endorse depression but states that she is feeling fine at this time (reports that she is recovering little by little).  Patient rates her depression a 5 out of 10 with 10 being most severe.  Patient states that her depressive episodes depend on whether or not she is experiencing bad thoughts.  She reports that she may experience depressive episodes half the days of the week.  Patient endorses the following depressive symptoms: feelings of sadness, lack of motivation, and hopelessness.  Patient denies decreased concentration, decreased energy, irritability, or feelings of worthlessness or guilt.  She reports that she will occasionally isolate herself stating that she is lacking trust in people.  Patient endorses anxiety stating that whenever she is stressed, she eats a lot of sweets.  Patient rates her anxiety a 9 out of 10 and states that her anxiety is accompanied by chewing her nails.  Patient reports that she is worried about her upcoming surgery as well as her current job standing.  She reports that some of the staff at her current job are racist and terrible.  A PHQ 9 screening was performed with the patient scoring an 18.  A GAD-7 screen was also performed with the patient scoring an 18.  Patient is alert and oriented x 4, calm, cooperative, and fully engaged in conversation during the encounter.  Patient describes her mood as fine.  Patient denies suicidal or homicidal ideations.  Patient reports that she will occasionally hear someone calling her name or see something move past her in her  peripheral vision (such as a shadow).  Patient does not appear to be responding to internal/external stimuli.  Patient endorses fair sleep and receives on average 7 hours of intermittent sleep per night.  Patient endorses decreased appetite and eats on average 1 meal per day.  Patient denies alcohol consumption stating that she last drank 1 L of whiskey from December 10 to 11.   Patient denies tobacco use or illicit drug use.  Visit Diagnosis:    ICD-10-CM   1. Sleep disturbances  G47.9 traZODone  (DESYREL ) 50 MG tablet    2. Bipolar affective disorder, currently depressed, moderate (HCC)  F31.32 lamoTRIgine  (LAMICTAL ) 25 MG tablet    OLANZapine  (ZYPREXA ) 7.5 MG tablet    3. GAD (generalized anxiety disorder)  F41.1 busPIRone  (BUSPAR ) 10 MG tablet      Past Psychiatric History:  Generalized anxiety disorder Bipolar disorder  06/2022: Seroquel  discontinued due to akathisia.  Patient started on Zyprexa  10 mg nightly and lithium  300 mg twice daily due to presenting hypomanic.  Patient also started on Cogentin  1 mg daily due to akathisia.   07/2022-patient Cogentin  1 mg discontinued due to improvement in symptoms.  Patient Zyprexa  10 mg and lithium  300 mg twice daily continued   08/2022-Lithium  discontinued due to concern for pregnancy   09/2022- Increased Zyprexa  to 5 mg in the morning with 10 mg at night, patient also diagnosed with pseudocyesis.  Patient started on clonidine  0.1 mg twice a day due to impulse control concerns   Patient reports that she has been 3 to 4 days at this facility when she found out her daughter passed away.  Patient also states that this was around the same time she found out that her husband had an affair and he accused her of abandoning family. - Per chart review, patient was admitted to admitted to [facility] ED for evaluation of suicidal ideation with a plan on 01/31/2022.  She informed the staff that she had a plan to jump off a bridge. - Patient was assessed by psychiatry via psych consult on 02/01/2022.   Patient denies a past history of suicide attempt.   Patient denies a past history of homicide attempt.  Past Medical History:  Past Medical History:  Diagnosis Date   Asthma    Bipolar disorder, current episode hypomanic (HCC)     Past Surgical History:  Procedure Laterality Date   arm surgery     CHOLECYSTECTOMY  1997     Family Psychiatric History:  Patient believes that her mother's side of the family suffers from mental illness.  Patient is unsure of the type of illnesses that run in her family due to not being raised with her family. Patient has multiple family members in the jail system including 2 of her sons.   Family history of suicide attempt: Daughter (deceased).  Patient reports that suicide has been frequent in her family. Family history of homicide attempt: Patient denies Family history of substance abuse: Brother (deceased) - died from heroin use.  Family History:  Family History  Problem Relation Age of Onset   Cancer Mother        Possibly Esophageal   Asthma Mother    COPD Mother        smoker   Bipolar disorder Mother        Possibly as well.  Not clear if diagnosed   Seizures Sister     Social History:  Social History   Socioeconomic History   Marital status: Media Planner  Spouse name: Sudie   Number of children: 4   Years of education: Not on file   Highest education level: Not on file  Occupational History   Not on file  Tobacco Use   Smoking status: Some Days    Types: Cigarettes   Smokeless tobacco: Never  Vaping Use   Vaping status: Never Used  Substance and Sexual Activity   Alcohol use: Yes    Comment: rarely   Drug use: No   Sexual activity: Not Currently  Other Topics Concern   Not on file  Social History Narrative   Lives with a boyfriend on and off for past year.   Her boyfriend travels a lot and she believes he has other relationships with other women.   No others live in the same home, though she feels the neighbors do not like her.     History of trauma   Her middle son and a friend in particular caused significant trauma when she lived in [place]    Social Drivers of Health   Tobacco Use: High Risk (12/02/2024)   Patient History    Smoking Tobacco Use: Some Days    Smokeless Tobacco Use: Never    Passive Exposure: Not on  file  Financial Resource Strain: Not on file  Food Insecurity: No Food Insecurity (03/23/2024)   Hunger Vital Sign    Worried About Running Out of Food in the Last Year: Never true    Ran Out of Food in the Last Year: Never true  Transportation Needs: No Transportation Needs (03/23/2024)   PRAPARE - Administrator, Civil Service (Medical): No    Lack of Transportation (Non-Medical): No  Physical Activity: Not on file  Stress: Not on file  Social Connections: Not on file  Depression (PHQ2-9): High Risk (12/02/2024)   Depression (PHQ2-9)    PHQ-2 Score: 18  Alcohol Screen: Not on file  Housing: High Risk (03/23/2024)   Housing Stability Vital Sign    Unable to Pay for Housing in the Last Year: Yes    Number of Times Moved in the Last Year: Not on file    Homeless in the Last Year: Yes  Utilities: Not on file  Health Literacy: Not on file    Allergies:  Allergies  Allergen Reactions   Seroquel  [Quetiapine ]     Severe akathisia    Metabolic Disorder Labs: Lab Results  Component Value Date   HGBA1C 5.4 06/22/2024   No results found for: PROLACTIN Lab Results  Component Value Date   CHOL 158 09/08/2020   TRIG 52 09/08/2020   HDL 66 09/08/2020   CHOLHDL 2.4 09/08/2020   LDLCALC 81 09/08/2020   Lab Results  Component Value Date   TSH 1.250 09/29/2024   TSH 2.060 09/08/2020    Therapeutic Level Labs: Lab Results  Component Value Date   LITHIUM  <0.1 (L) 09/04/2022   LITHIUM  <0.1 (L) 07/24/2022   No results found for: VALPROATE No results found for: CBMZ  Current Medications: Current Outpatient Medications  Medication Sig Dispense Refill   traZODone  (DESYREL ) 50 MG tablet Take 1 tablet (50 mg total) by mouth at bedtime as needed for sleep. 30 tablet 1   albuterol  (VENTOLIN  HFA) 108 (90 Base) MCG/ACT inhaler Inhale 2 puffs into the lungs every 6 (six) hours as needed for wheezing or shortness of breath. (Patient not taking: Reported on 09/29/2024) 8.5  g 2   Artificial Saliva (BIOTENE DRY MOUTH MOIST SPRAY) SOLN Use as  directed 2 sprays in the mouth or throat at bedtime as needed. (Patient not taking: Reported on 09/29/2024) 44.3 mL 2   busPIRone  (BUSPAR ) 10 MG tablet Take 1 tablet (10 mg total) by mouth 2 (two) times daily. 60 tablet 1   cloNIDine  (CATAPRES ) 0.1 MG tablet Take 1 tablet (0.1 mg total) by mouth 2 (two) times daily. (Patient not taking: Reported on 09/29/2024) 60 tablet 11   COVID-19 mRNA vaccine, Moderna, 100 MCG/0.5ML injection Inject into the muscle. (Patient not taking: Reported on 09/29/2024) 0.25 mL 0   ferrous sulfate  325 (65 FE) MG tablet TAKE 1 TABLET BY MOUTH IN THE MORNING AND AT BEDTIME. (Patient not taking: Reported on 09/29/2024) 60 tablet 2   hydrOXYzine  (ATARAX ) 25 MG tablet Take 1 tablet (25 mg total) by mouth 3 (three) times daily as needed. (Patient not taking: Reported on 09/29/2024) 75 tablet 1   lamoTRIgine  (LAMICTAL ) 25 MG tablet Take 1 tablet (25 mg total) by mouth daily. 30 tablet 1   lidocaine  (LIDODERM ) 5 % Place 1 patch onto the skin daily. Remove & Discard patch within 12 hours or as directed by MD (Patient not taking: Reported on 09/29/2024) 30 patch 0   meloxicam  (MOBIC ) 15 MG tablet Take 1 tablet (15 mg total) by mouth daily. (Patient not taking: Reported on 09/29/2024) 30 tablet 0   methocarbamol  (ROBAXIN ) 500 MG tablet Take 1 tablet (500 mg total) by mouth every 8 (eight) hours as needed for muscle spasms. (Patient not taking: Reported on 09/29/2024) 20 tablet 0   methylPREDNISolone  (MEDROL  DOSEPAK) 4 MG TBPK tablet Take As directed (Patient not taking: Reported on 09/29/2024) 21 each 0   metroNIDAZOLE  (FLAGYL ) 500 MG tablet Take 1 tablet (500 mg total) by mouth 2 (two) times daily. 14 tablet 3   mometasone  (ELOCON ) 0.1 % ointment Apply topically 2 (two) times daily. 45 g 1   OLANZapine  (ZYPREXA ) 7.5 MG tablet Take 1 tablet (7.5 mg total) by mouth at bedtime. 30 tablet 1   ondansetron  (ZOFRAN ) 4 MG  tablet Take 1 tablet (4 mg total) by mouth every 6 (six) hours. (Patient not taking: Reported on 09/29/2024) 12 tablet 0   traMADol  (ULTRAM ) 50 MG tablet Take 1 tablet (50 mg total) by mouth every 6 (six) hours as needed for severe pain (pain score 7-10) or moderate pain (pain score 4-6). (Patient not taking: Reported on 09/29/2024) 20 tablet 0   Current Facility-Administered Medications  Medication Dose Route Frequency Provider Last Rate Last Admin   medroxyPROGESTERone  (DEPO-PROVERA ) injection 150 mg  150 mg Intramuscular Q90 days    150 mg at 09/29/24 1015     Musculoskeletal: Strength & Muscle Tone: within normal limits Gait & Station: normal Patient leans: N/A  Psychiatric Specialty Exam: Review of Systems  Psychiatric/Behavioral:  Positive for dysphoric mood and sleep disturbance. Negative for decreased concentration, hallucinations, self-injury and suicidal ideas. The patient is nervous/anxious. The patient is not hyperactive.     Blood pressure 108/78, pulse 73, temperature (!) 97.3 F (36.3 C), temperature source Oral, height 5' 4 (1.626 m), weight 147 lb 12.8 oz (67 kg), SpO2 99%.Body mass index is 25.37 kg/m.  General Appearance: Casual  Eye Contact:  Good  Speech:  Clear and Coherent and Normal Rate  Volume:  Normal  Mood:  Anxious and Depressed  Affect:  Appropriate  Thought Process:  Coherent, Goal Directed, and Descriptions of Associations: Intact  Orientation:  Full (Time, Place, and Person)  Thought Content: WDL   Suicidal Thoughts:  No  Homicidal Thoughts:  No  Memory:  Immediate;   Good Recent;   Good Remote;   Good  Judgement:  Good  Insight:  Good  Psychomotor Activity:  Normal  Concentration:  Concentration: Good and Attention Span: Good  Recall:  Good  Fund of Knowledge: Good  Language: Good  Akathisia:  No  Handed:  Right  AIMS (if indicated): not done  Assets:  Communication Skills Desire for Improvement Financial  Resources/Insurance Housing Social Support Vocational/Educational  ADL's:  Intact  Cognition: WNL  Sleep:  Fair   Screenings: AIMS    Flowsheet Row Clinical Support from 12/02/2024 in [facility] Clinical Support from 10/21/2024 in [facility] Clinical Support from 08/28/2024 in [facility]  AIMS Total Score 0 0 0   GAD-7    Flowsheet Row Clinical Support from 12/02/2024 in [facility]  Clinical Support from 10/21/2024 in [facility] Office Visit from 09/29/2024 in [facility] for [facility] at [facility] Clinical Support from 08/28/2024 in [facility] Office Visit from 07/17/2024 in [facility]  Total GAD-7 Score 18 16 15 16 20    PHQ2-9    Flowsheet Row Clinical Support from 12/02/2024 in [facility] Clinical Support from 10/21/2024 in [facility] Office Visit from 09/29/2024 in [facility] for [facility] Healthcare at [facility] Clinical Support from 08/28/2024 in [facility] Office Visit from 07/17/2024 in [facility]  PHQ-2 Total Score 2 4 6 6 6   PHQ-9 Total Score 18 21 17 19 26    Flowsheet Row Clinical Support from 12/02/2024 in [facility] Clinical Support from 10/21/2024 in [facility] Clinical Support from 08/28/2024 in [facility]  C-SSRS RISK CATEGORY Moderate Risk Moderate Risk Moderate Risk     Assessment and Plan:   Gina Burton is a 53 year old female with a past psychiatric history significant for generalized anxiety disorder and bipolar affective disorder (currently depressed, moderate) who presents to [facility] for follow-up and medication management.  Patient presents to the encounter stating that she continues to take her medications regularly.  She reports that she has been experiencing blurred vision and headaches but is unsure if her medications is the cause of this issue.  An aims assessment was performed with the patient scoring of 0.  Patient continues to experience depression attributed to the upcoming anniversary of her daughter's passing.  Though patient endorses depression, she reports that her medications allow her to be stable.  Patient also endorses anxiety attributed to stressors in her life such as her upcoming surgery to remove her uterus and stressors related to her workplace environment.  A PHQ-9 screening was performed the patient scoring an 18.  A GAD-7 screen was also performed with the patient scoring an 18.  Patient reports that she has also been experiencing disturbed sleep.  She reports that she will often go to bed early only to wake up at 2/3 in the morning.  Patient wishes that she could sleep more to avoid thinking about the stressors in her life.  Patient informed provider that she will occasionally feel down some days and will take double of her medication.  Provider recommended adjusting her olanzapine  dosage from 5 mg to 7.5 mg at bedtime for  mood stability.  Provider also recommended patient increase her buspirone  dosage from 7.5 mg to 10 mg 2 times daily for the management of her anxiety.  Lastly, provider recommended patient add on trazodone  50 mg at bedtime for the management of her sleep disturbances.  Patient was agreeable to recommendations.  Patient's medications to be e-prescribed to pharmacy of choice.  An EKG was performed on this patient on 03/10/2024.  Patient's QTc was 456.  Will continue to monitor patient's QTc for future appointments.  The following labs were obtained from the patient: Comprehensive metabolic panel, hemoglobin A1c, complete  blood count with differential. - Comprehensive metabolic panel (03/09/2024): CMP significant for decreased potassium (3.3 mmol/L), decreased CO2 (21 mmol/L), increased blood glucose (107 mg/dL), and increased BUN (22 mg/dL)  - Hemoglobin J8r (01/18/7973): Hemoglobin A1c within normal limits - Complete blood count with differential (07/14/2024): CBC with differential significant for decreased hemoglobin (11 g/dL), decreased HCT (64.3%), and decreased MCH (24.9 pg), increased RDW (17.5%)  A [place] Suicide Severity Rating Scale was performed with the patient being considered moderate risk.  Patient denies suicidal ideations and is able to contract for safety at this time.  Safety planning was discussed with the patient prior to the conclusion of the encounter.             - Patient was instructed to contact 911 in the event of a mental health crisis. - Patient was instructed to contact [facility] in the event of a mental health crisis. - Patient was instructed to present to [facility] in the event of a mental health crisis.  Collaboration of Care: Collaboration of Care: Medication Management AEB provider managing patient's psychiatric medications, Primary Care Provider AEB patient being followed by internal medicine, Psychiatrist AEB patient being followed by mental health provider at this facility, and Other provider involved in patient's care AEB patient being seen by rehabilitation  Patient/Guardian was advised Release of Information must be obtained prior to any record release in order to collaborate their care with an outside provider. Patient/Guardian was advised if they have not already done so to contact the registration department to sign all necessary forms in order for us  to release information regarding their care.   Consent: Patient/Guardian gives verbal consent for treatment and assignment of benefits for services provided during this  visit. Patient/Guardian expressed understanding and agreed to proceed.   1. Bipolar affective disorder, currently depressed, moderate (HCC)  - lamoTRIgine  (LAMICTAL ) 25 MG tablet; Take 1 tablet (25 mg total) by mouth daily.  Dispense: 30 tablet; Refill: 1 - OLANZapine  (ZYPREXA ) 7.5 MG tablet; Take 1 tablet (7.5 mg total) by mouth at bedtime.  Dispense: 30 tablet; Refill: 1  2. GAD (generalized anxiety disorder)  - busPIRone  (BUSPAR ) 10 MG tablet; Take 1 tablet (10 mg total) by mouth 2 (two) times daily.  Dispense: 60 tablet; Refill: 1  3. Sleep disturbances (Primary)  - traZODone  (DESYREL ) 50 MG tablet; Take 1 tablet (50 mg total) by mouth at bedtime as needed for sleep.  Dispense: 30 tablet; Refill: 1  Patient to follow-up in 6 weeks Provider spent a total of 45 minutes with the patient/reviewing patient's chart  Reginia FORBES Bolster, PA 12/02/2024, 1:00 PM

## 2024-12-03 ENCOUNTER — Other Ambulatory Visit: Payer: Self-pay

## 2024-12-21 ENCOUNTER — Encounter (HOSPITAL_BASED_OUTPATIENT_CLINIC_OR_DEPARTMENT_OTHER): Payer: Self-pay | Admitting: Obstetrics & Gynecology

## 2024-12-21 ENCOUNTER — Other Ambulatory Visit (HOSPITAL_COMMUNITY)
Admission: RE | Admit: 2024-12-21 | Discharge: 2024-12-21 | Disposition: A | Discharge status: Home / Self Care | Source: Ambulatory Visit | Attending: Obstetrics & Gynecology | Admitting: Obstetrics & Gynecology

## 2024-12-21 ENCOUNTER — Ambulatory Visit (HOSPITAL_BASED_OUTPATIENT_CLINIC_OR_DEPARTMENT_OTHER): Admitting: Obstetrics & Gynecology

## 2024-12-21 VITALS — BP 119/72 | HR 69 | Ht 64.0 in | Wt 150.2 lb

## 2024-12-21 DIAGNOSIS — Z862 Personal history of diseases of the blood and blood-forming organs and certain disorders involving the immune mechanism: Secondary | ICD-10-CM

## 2024-12-21 DIAGNOSIS — L900 Lichen sclerosus et atrophicus: Secondary | ICD-10-CM | POA: Insufficient documentation

## 2024-12-21 DIAGNOSIS — N921 Excessive and frequent menstruation with irregular cycle: Secondary | ICD-10-CM | POA: Diagnosis present

## 2024-12-21 DIAGNOSIS — Z8619 Personal history of other infectious and parasitic diseases: Secondary | ICD-10-CM

## 2024-12-21 DIAGNOSIS — Z758 Other problems related to medical facilities and other health care: Secondary | ICD-10-CM

## 2024-12-21 DIAGNOSIS — D250 Submucous leiomyoma of uterus: Secondary | ICD-10-CM

## 2024-12-21 NOTE — Progress Notes (Deleted)
 Gina Burton

## 2024-12-21 NOTE — Progress Notes (Signed)
 "  GYNECOLOGY  VISIT  CC:   vulvar recheck, repeat endometrial biopsy, fibroid uterus   HPI: 54 y.o. [phone] Domestic Partner   Other or two or more races Native Hawaiian or Other Pacific Islander female here for 4 week follow up of vulvar itching with exam findings c/w lichen sclerosus.  Her insurance would not cover clobetasol  so she was prescribed mometasone  and she has been using it twice daily.  Itching is much improved.  She still feels there is some skin thickening present.  Feels much better and has stopped all of the OTC products she was using.    Pt was seen by Arland Roller, CNM, on 11/17 and endometrial biopsy was obtained.  Results showed proliferative endometrium with focal mucinous metaplasia.  Repeat biopsy recommended and is going to proceed with this today.  Ultrasound done 10/14/205 showed uterus 12.4 x 7.7 x 9.8cm with volume of ~513ml.  Multiple fibroids seen with largest in the anterior uterine wall measuring 5.8cm x 6.0 x 5.9cm.  Endometrium was 11mm with a 6 x 6 x 6mm hyperechoic rounded focus within the fundal portion of the endometrium.  Additional evaluation recommended.  SHGM performed 12/03/2024 and no polyp was noted with 7.8cm fibroid appeared to be a Type 2 fibroid with part of it in the endometrial cavity.    Most recent hb ws 1.5 on 09/30/2023.  She odes have a hx of anemia. She is taking iron .  She received a Depo Provera  injection on 10/14.  Unsure if wants to continue.    Past Medical History:  Diagnosis Date   Asthma    Bipolar disorder, current episode hypomanic (HCC)     MEDS:  Reviewed in EPIC  ALLERGIES: Seroquel  [quetiapine ]  SH:  single, non smoker  Review of Systems  Constitutional: Negative.   Genitourinary:        Vulvar itching Heavy menstrual bleeding    PHYSICAL EXAMINATION:    BP 119/72   Pulse 69   Ht 5' 4 (1.626 m)   Wt 150 lb 3.2 oz (68.1 kg)   BMI 25.78 kg/m     General appearance: alert, cooperative and appears stated  age  Lymph:  no inguinal LAD noted Pelvic: External genitalia:  no lesions              Urethra:  normal appearing urethra with no masses, tenderness or lesions              Bartholins and Skenes: normal                 Vagina: normal mucosa without prolapse or lesions              Cervix: no lesions              Bimanual Exam:  Uterus:  enlarged, 12 weeks size with larger anterior fibroid noted, mobile              Adnexa: no mass, fullness, tenderness  Endometrial biopsy recommended.  Discussed with patient.  Verbal and written consent obtained.   Procedure:  Speculum placed.  Cervix visualized and cleansed with betadine prep.  A single toothed tenaculum was applied to the anterior lip of the cervix.  Endometrial pipelle was advanced through the cervix into the endometrial cavity without difficulty.  Pipelle passed to 8cm.  Suction applied and pipelle removed with good tissue sample obtained.  Tenculum removed.  No bleeding noted.  Patient tolerated procedure well.  Chaperone was present  for exam.  Assessment/Plan: 1. Menorrhagia with irregular cycle (Primary) - endometrial biopsy repeated today - Surgical pathology( [place]/ [facility])  2. Intramural and submucous leiomyoma of uterus - pt is considering hysterectomy.  Discussed more today.  Questions answered.    3. History of trichomoniasis - TOC done in November and was negaitve  4. Lichen sclerosus - pt will continue to use the mometasone  0.1% topically BID and recheck 1 month  5. History of anemia - on oral iron   6. Spanish language interpreter needed - interpreter present for entire appointment and translated all communication.  Total time with pt:  62 minutes Time for documentation:  another 12 minutes  "

## 2024-12-22 LAB — SURGICAL PATHOLOGY

## 2024-12-23 ENCOUNTER — Encounter: Payer: Self-pay | Admitting: Nurse Practitioner

## 2024-12-23 ENCOUNTER — Other Ambulatory Visit: Payer: Self-pay

## 2024-12-23 ENCOUNTER — Ambulatory Visit: Payer: Self-pay | Admitting: Nurse Practitioner

## 2024-12-23 VITALS — BP 108/92 | HR 78 | Temp 97.4°F | Wt 151.0 lb

## 2024-12-23 DIAGNOSIS — M545 Low back pain, unspecified: Secondary | ICD-10-CM | POA: Diagnosis not present

## 2024-12-23 DIAGNOSIS — G8929 Other chronic pain: Secondary | ICD-10-CM

## 2024-12-23 MED ORDER — KETOROLAC TROMETHAMINE 30 MG/ML IJ SOLN
30.0000 mg | Freq: Once | INTRAMUSCULAR | Status: AC
  Administered 2024-12-23: 30 mg via INTRAMUSCULAR

## 2024-12-23 MED ORDER — MELOXICAM 15 MG PO TABS
15.0000 mg | ORAL_TABLET | Freq: Every day | ORAL | 0 refills | Status: DC
  Filled 2024-12-23: qty 30, 30d supply, fill #0

## 2024-12-23 NOTE — Progress Notes (Signed)
 "  Subjective   Patient ID: Gina Burton, female    DOB: [birth year], 54 y.o.   MRN: [id number]  Chief Complaint  Patient presents with   Follow-up    6 month. Would like weight loss pills. Would need work note.     Referring provider: [name], NP  Mercer Gina Burton is a 54 y.o. female with Past Medical History: No date: Asthma No date: Bipolar disorder, current episode hypomanic (HCC)   HPI  Patient presents today for low back pain. She has been having sciatica for a while now. Will trial Toradol  injection. Patient is following with ob/gyn for fibroids. Denies f/c/s, n/v/d, hemoptysis, PND, leg swelling Denies chest pain or edema    Allergies[1]  Immunization History  Administered Date(s) Administered   Influenza,inj,Quad PF,6+ Mos 10/26/2021   Moderna SARS-COV2 Booster Vaccination 04/19/2021   Moderna Sars-Covid-2 Vaccination 08/31/2020, 09/28/2020   PNEUMOCOCCAL CONJUGATE-20 10/26/2021   Tdap 02/15/2016, 11/14/2018    Tobacco History: Tobacco Use History[2] Ready to quit: Not Answered Counseling given: Not Answered   Outpatient Encounter Medications as of 12/23/2024  Medication Sig   busPIRone  (BUSPAR ) 10 MG tablet Take 1 tablet (10 mg total) by mouth 2 (two) times daily.   lamoTRIgine  (LAMICTAL ) 25 MG tablet Take 1 tablet (25 mg total) by mouth daily.   traMADol  (ULTRAM ) 50 MG tablet Take 1 tablet (50 mg total) by mouth every 6 (six) hours as needed for severe pain (pain score 7-10) or moderate pain (pain score 4-6).   traZODone  (DESYREL ) 50 MG tablet Take 1 tablet (50 mg total) by mouth at bedtime as needed for sleep.   albuterol  (VENTOLIN  HFA) 108 (90 Base) MCG/ACT inhaler Inhale 2 puffs into the lungs every 6 (six) hours as needed for wheezing or shortness of breath. (Patient not taking: Reported on 12/23/2024)   Artificial Saliva (BIOTENE DRY MOUTH MOIST SPRAY) SOLN Use as directed 2 sprays in the mouth or throat at bedtime as  needed. (Patient not taking: Reported on 12/23/2024)   cloNIDine  (CATAPRES ) 0.1 MG tablet Take 1 tablet (0.1 mg total) by mouth 2 (two) times daily. (Patient not taking: Reported on 12/23/2024)   COVID-19 mRNA vaccine, Moderna, 100 MCG/0.5ML injection Inject into the muscle. (Patient not taking: Reported on 12/23/2024)   ferrous sulfate  325 (65 FE) MG tablet TAKE 1 TABLET BY MOUTH IN THE MORNING AND AT BEDTIME. (Patient not taking: Reported on 12/23/2024)   hydrOXYzine  (ATARAX ) 25 MG tablet Take 1 tablet (25 mg total) by mouth 3 (three) times daily as needed. (Patient not taking: Reported on 12/23/2024)   lidocaine  (LIDODERM ) 5 % Place 1 patch onto the skin daily. Remove & Discard patch within 12 hours or as directed by MD (Patient not taking: Reported on 12/23/2024)   meloxicam  (MOBIC ) 15 MG tablet Take 1 tablet (15 mg total) by mouth daily.   methocarbamol  (ROBAXIN ) 500 MG tablet Take 1 tablet (500 mg total) by mouth every 8 (eight) hours as needed for muscle spasms. (Patient not taking: Reported on 12/23/2024)   methylPREDNISolone  (MEDROL  DOSEPAK) 4 MG TBPK tablet Take As directed (Patient not taking: Reported on 12/23/2024)   metroNIDAZOLE  (FLAGYL ) 500 MG tablet Take 1 tablet (500 mg total) by mouth 2 (two) times daily. (Patient not taking: Reported on 12/23/2024)   mometasone  (ELOCON ) 0.1 % ointment Apply topically 2 (two) times daily. (Patient not taking: Reported on 12/23/2024)   OLANZapine  (ZYPREXA ) 7.5 MG tablet Take 1 tablet (7.5 mg total) by mouth at  bedtime. (Patient not taking: Reported on 12/23/2024)   ondansetron  (ZOFRAN ) 4 MG tablet Take 1 tablet (4 mg total) by mouth every 6 (six) hours. (Patient not taking: Reported on 12/23/2024)   [DISCONTINUED] meloxicam  (MOBIC ) 15 MG tablet Take 1 tablet (15 mg total) by mouth daily. (Patient not taking: Reported on 12/23/2024)   Facility-Administered Encounter Medications as of 12/23/2024  Medication   [COMPLETED] ketorolac  (TORADOL ) 30 MG/ML injection 30 mg    medroxyPROGESTERone  (DEPO-PROVERA ) injection 150 mg    Review of Systems  Review of Systems  Constitutional: Negative.   HENT: Negative.    Cardiovascular: Negative.   Gastrointestinal: Negative.   Musculoskeletal:  Positive for back pain.  Allergic/Immunologic: Negative.   Neurological: Negative.   Psychiatric/Behavioral: Negative.       Objective:   BP (!) 108/92 (BP Location: Right Arm, Patient Position: Sitting, Cuff Size: Normal)   Pulse 78   Temp (!) 97.4 F (36.3 C) (Temporal)   Wt 151 lb (68.5 kg)   SpO2 98%   BMI 25.92 kg/m   Wt Readings from Last 5 Encounters:  12/28/24 155 lb 3.2 oz (70.4 kg)  12/23/24 151 lb (68.5 kg)  12/21/24 150 lb 3.2 oz (68.1 kg)  11/02/24 147 lb 6.4 oz (66.9 kg)  09/29/24 154 lb 12.8 oz (70.2 kg)     Physical Exam Vitals and nursing note reviewed.  Constitutional:      General: She is not in acute distress.    Appearance: She is well-developed.  Cardiovascular:     Rate and Rhythm: Normal rate and regular rhythm.  Pulmonary:     Effort: Pulmonary effort is normal.     Breath sounds: Normal breath sounds.  Musculoskeletal:     Lumbar back: Tenderness present. Decreased range of motion.  Neurological:     Mental Status: She is alert and oriented to person, place, and time.       Assessment & Plan:   Chronic midline low back pain without sciatica -     Ketorolac  Tromethamine   Other orders -     Meloxicam ; Take 1 tablet (15 mg total) by mouth daily.  Dispense: 30 tablet; Refill: 0     Return in about 6 months (around 06/22/2025).   Bascom GORMAN Borer, NP 12/28/2024     [1]  Allergies Allergen Reactions   Seroquel  [Quetiapine ]     Severe akathisia  [2]  Social History Tobacco Use  Smoking Status Some Days   Types: Cigarettes  Smokeless Tobacco Never   "

## 2024-12-28 ENCOUNTER — Encounter: Payer: Self-pay | Admitting: Nurse Practitioner

## 2024-12-28 ENCOUNTER — Ambulatory Visit: Admitting: Nurse Practitioner

## 2024-12-28 ENCOUNTER — Other Ambulatory Visit: Payer: Self-pay

## 2024-12-28 VITALS — BP 128/73 | HR 72 | Temp 98.1°F | Wt 155.2 lb

## 2024-12-28 DIAGNOSIS — M5441 Lumbago with sciatica, right side: Secondary | ICD-10-CM

## 2024-12-28 DIAGNOSIS — G8929 Other chronic pain: Secondary | ICD-10-CM | POA: Diagnosis not present

## 2024-12-28 MED ORDER — MELOXICAM 15 MG PO TABS
15.0000 mg | ORAL_TABLET | Freq: Every day | ORAL | 0 refills | Status: AC
  Filled 2024-12-28: qty 30, 30d supply, fill #0

## 2024-12-28 MED ORDER — KETOROLAC TROMETHAMINE 30 MG/ML IJ SOLN
30.0000 mg | Freq: Once | INTRAMUSCULAR | Status: AC
  Administered 2024-12-28: 30 mg via INTRAMUSCULAR

## 2024-12-28 NOTE — Progress Notes (Signed)
 "  Subjective   Patient ID: Gina Burton, female    DOB: 05/16/1971, 54 y.o.   MRN: [id number]  Chief Complaint  Patient presents with   Vaginal Bleeding    Since August. Biopsy done January 5th and bleeding every since. Need back pain injection. Lost prescription for inflammation and needs another one.     Referring provider: Oley Gina RAMAN, NP  Mercer Mardeen Gina Burton is a 54 y.o. female with Past Medical History: No date: Asthma No date: Bipolar disorder, current episode hypomanic [facility])  HPI  Patient presents today for an acute visit.  She is complaining of vaginal bleeding but we discussed that she has been to see OB/GYN and does need to discuss this with them instead of here at this visit.  She does complain of low back pain that is chronic.  We will place an order for Toradol  today. Denies f/c/s, n/v/d, hemoptysis, PND, leg swelling Denies chest pain or edema    Allergies[1]  Immunization History  Administered Date(s) Administered   Influenza,inj,Quad PF,6+ Mos 10/26/2021   Moderna SARS-COV2 Booster Vaccination 04/19/2021   Moderna Sars-Covid-2 Vaccination 08/31/2020, 09/28/2020   PNEUMOCOCCAL CONJUGATE-20 10/26/2021   Tdap 02/15/2016, 11/14/2018    Tobacco History: Tobacco Use History[2] Ready to quit: Not Answered Counseling given: Not Answered   Outpatient Encounter Medications as of 12/28/2024  Medication Sig   busPIRone  (BUSPAR ) 10 MG tablet Take 1 tablet (10 mg total) by mouth 2 (two) times daily.   lamoTRIgine  (LAMICTAL ) 25 MG tablet Take 1 tablet (25 mg total) by mouth daily.   traMADol  (ULTRAM ) 50 MG tablet Take 1 tablet (50 mg total) by mouth every 6 (six) hours as needed for severe pain (pain score 7-10) or moderate pain (pain score 4-6).   traZODone  (DESYREL ) 50 MG tablet Take 1 tablet (50 mg total) by mouth at bedtime as needed for sleep.   [DISCONTINUED] meloxicam  (MOBIC ) 15 MG tablet Take 1 tablet (15 mg total) by mouth daily.    albuterol  (VENTOLIN  HFA) 108 (90 Base) MCG/ACT inhaler Inhale 2 puffs into the lungs every 6 (six) hours as needed for wheezing or shortness of breath. (Patient not taking: Reported on 12/23/2024)   Artificial Saliva (BIOTENE DRY MOUTH MOIST SPRAY) SOLN Use as directed 2 sprays in the mouth or throat at bedtime as needed. (Patient not taking: Reported on 12/23/2024)   cloNIDine  (CATAPRES ) 0.1 MG tablet Take 1 tablet (0.1 mg total) by mouth 2 (two) times daily. (Patient not taking: Reported on 12/23/2024)   COVID-19 mRNA vaccine, Moderna, 100 MCG/0.5ML injection Inject into the muscle. (Patient not taking: Reported on 12/23/2024)   ferrous sulfate  325 (65 FE) MG tablet TAKE 1 TABLET BY MOUTH IN THE MORNING AND AT BEDTIME. (Patient not taking: Reported on 12/23/2024)   hydrOXYzine  (ATARAX ) 25 MG tablet Take 1 tablet (25 mg total) by mouth 3 (three) times daily as needed. (Patient not taking: Reported on 12/23/2024)   lidocaine  (LIDODERM ) 5 % Place 1 patch onto the skin daily. Remove & Discard patch within 12 hours or as directed by MD (Patient not taking: Reported on 12/23/2024)   meloxicam  (MOBIC ) 15 MG tablet Take 1 tablet (15 mg total) by mouth daily.   methocarbamol  (ROBAXIN ) 500 MG tablet Take 1 tablet (500 mg total) by mouth every 8 (eight) hours as needed for muscle spasms. (Patient not taking: Reported on 12/23/2024)   methylPREDNISolone  (MEDROL  DOSEPAK) 4 MG TBPK tablet Take As directed (Patient not taking: Reported on 12/23/2024)  metroNIDAZOLE  (FLAGYL ) 500 MG tablet Take 1 tablet (500 mg total) by mouth 2 (two) times daily. (Patient not taking: Reported on 12/23/2024)   mometasone  (ELOCON ) 0.1 % ointment Apply topically 2 (two) times daily. (Patient not taking: Reported on 12/23/2024)   OLANZapine  (ZYPREXA ) 7.5 MG tablet Take 1 tablet (7.5 mg total) by mouth at bedtime. (Patient not taking: Reported on 12/23/2024)   ondansetron  (ZOFRAN ) 4 MG tablet Take 1 tablet (4 mg total) by mouth every 6 (six) hours. (Patient  not taking: Reported on 12/23/2024)   Facility-Administered Encounter Medications as of 12/28/2024  Medication   [COMPLETED] ketorolac  (TORADOL ) 30 MG/ML injection 30 mg   medroxyPROGESTERone  (DEPO-PROVERA ) injection 150 mg    Review of Systems  Review of Systems  Constitutional: Negative.   HENT: Negative.    Cardiovascular: Negative.   Gastrointestinal: Negative.   Musculoskeletal:  Positive for back pain.  Allergic/Immunologic: Negative.   Neurological: Negative.   Psychiatric/Behavioral: Negative.       Objective:   BP 128/73   Pulse 72   Temp 98.1 F (36.7 C) (Temporal)   Wt 155 lb 3.2 oz (70.4 kg)   SpO2 96%   BMI 26.64 kg/m   Wt Readings from Last 5 Encounters:  12/28/24 155 lb 3.2 oz (70.4 kg)  12/23/24 151 lb (68.5 kg)  12/21/24 150 lb 3.2 oz (68.1 kg)  11/02/24 147 lb 6.4 oz (66.9 kg)  09/29/24 154 lb 12.8 oz (70.2 kg)     Physical Exam Vitals and nursing note reviewed.  Constitutional:      General: She is not in acute distress.    Appearance: She is well-developed.  Cardiovascular:     Rate and Rhythm: Normal rate and regular rhythm.  Pulmonary:     Effort: Pulmonary effort is normal.     Breath sounds: Normal breath sounds.  Neurological:     Mental Status: She is alert and oriented to person, place, and time.       Assessment & Plan:   Chronic bilateral low back pain with right-sided sciatica -     Meloxicam ; Take 1 tablet (15 mg total) by mouth daily.  Dispense: 30 tablet; Refill: 0 -     Ketorolac  Tromethamine      Return if symptoms worsen or fail to improve.   Gina GORMAN Borer, NP 01/13/2025     [1]  Allergies Allergen Reactions   Seroquel  [Quetiapine ]     Severe akathisia  [2]  Social History Tobacco Use  Smoking Status Some Days   Types: Cigarettes  Smokeless Tobacco Never   "

## 2024-12-30 ENCOUNTER — Other Ambulatory Visit: Payer: Self-pay | Admitting: Nurse Practitioner

## 2024-12-30 ENCOUNTER — Other Ambulatory Visit: Payer: Self-pay

## 2024-12-30 MED ORDER — TRIAMCINOLONE ACETONIDE 0.5 % EX OINT
1.0000 | TOPICAL_OINTMENT | Freq: Two times a day (BID) | CUTANEOUS | 0 refills | Status: AC
  Filled 2024-12-30: qty 30, 15d supply, fill #0

## 2025-01-07 ENCOUNTER — Other Ambulatory Visit: Payer: Self-pay

## 2025-01-08 ENCOUNTER — Other Ambulatory Visit: Payer: Self-pay

## 2025-01-13 ENCOUNTER — Encounter (HOSPITAL_COMMUNITY): Payer: Self-pay | Admitting: Physician Assistant

## 2025-01-13 ENCOUNTER — Ambulatory Visit (INDEPENDENT_AMBULATORY_CARE_PROVIDER_SITE_OTHER): Admitting: Physician Assistant

## 2025-01-13 ENCOUNTER — Other Ambulatory Visit: Payer: Self-pay

## 2025-01-13 ENCOUNTER — Encounter: Payer: Self-pay | Admitting: Nurse Practitioner

## 2025-01-13 DIAGNOSIS — F411 Generalized anxiety disorder: Secondary | ICD-10-CM | POA: Diagnosis not present

## 2025-01-13 DIAGNOSIS — F3132 Bipolar disorder, current episode depressed, moderate: Secondary | ICD-10-CM | POA: Diagnosis not present

## 2025-01-13 DIAGNOSIS — G479 Sleep disorder, unspecified: Secondary | ICD-10-CM

## 2025-01-13 MED ORDER — OLANZAPINE 10 MG PO TABS
10.0000 mg | ORAL_TABLET | Freq: Every day | ORAL | 1 refills | Status: AC
  Filled 2025-01-13: qty 30, 30d supply, fill #0

## 2025-01-13 MED ORDER — BUSPIRONE HCL 10 MG PO TABS
10.0000 mg | ORAL_TABLET | Freq: Two times a day (BID) | ORAL | 1 refills | Status: AC
  Filled 2025-01-13: qty 60, 30d supply, fill #0

## 2025-01-13 MED ORDER — TRAZODONE HCL 50 MG PO TABS
50.0000 mg | ORAL_TABLET | Freq: Every evening | ORAL | 1 refills | Status: AC | PRN
  Filled 2025-01-13: qty 30, 30d supply, fill #0

## 2025-01-13 MED ORDER — LAMOTRIGINE 25 MG PO TABS
50.0000 mg | ORAL_TABLET | Freq: Every day | ORAL | 1 refills | Status: AC
  Filled 2025-01-13: qty 60, 30d supply, fill #0

## 2025-01-13 NOTE — Progress Notes (Unsigned)
 BH MD/PA/NP OP Progress Note  01/13/2025 1:00 PM Gina Burton Gina Burton  MRN:  [id number]  Chief Complaint:  Chief Complaint  Patient presents with   Follow-up   Medication Management   HPI:   Gina Burton is a 54 year old female with a past psychiatric history significant for generalized anxiety disorder and bipolar affective disorder (currently depressed, moderate) who presents to [facility] for follow-up and medication management.  Due to patient being Spanish-speaking, Spanish interpretive services were utilized during the duration of the encounter.  Patient is currently being managed on the following psychiatric medications:  Olanzapine  7.5 mg at bedtime Buspirone  10 mg 2 times daily Lamotrigine  25 mg daily Trazodone  50 mg at bedtime as needed  Patient presents to the encounter stating that she recently lost her job on Wednesday.  She also reports that her son was picked up by the authorities from [place] and has been sent to prison.  She reports that her son is currently detained in [place], [place]  awaiting to be transferred.  Patient reports that she is worried about what else is going to happen in her life.  Patient denies any other major issues or concerns but believes that she needs to take something stronger for what she is currently going through.  Patient continues to endorse emotions stemming from her daughter's passing.  She reports that she is unsure if she can continue to persist the way she is and states that if she did not believe in God, she does not know where she would be.  Before her recent hardships, patient reports that her medications were helpful in managing her symptoms.  She reports that she is going through so much that the only thing that would make it worse is if she lost her current living situation.  Patient believes that she was born with bad luck.  Despite her current stressors, patient  reports that she has been taking her medications regularly.  Provider discussed setting patient up with therapy to discuss her current stressors.  Patient was receptive to information.  Patient rates her depression an 8 out of 10 with 10 being most severe.  She denies suicidal ideations at this time but states that her heart hurts (emotionally) even when she attempts to get better.  She informed provider that when her daughter died, she left her dog for her to take care of, and now her son has left his cat for her to take care of.  She reports that she must take care of her children's pets to make them happy.  She reports that she has been experiencing depressive episodes for the past 2 weeks.  Patient endorses the following depressive symptoms: feelings of sadness, lack of motivation, decreased concentration, decreased energy, and feelings of guilt/worthlessness.  Patient denies irritability.  Patient reports that she feels most guilty over her son going to prison and feels like it is her fault.  Patient denies anxiety and states that she has been praying a lot and trying to get things in her life in order.  A PHQ-9 screen was performed with the patient scoring an 18.  A GAD-7 screen was also performed with the patient scoring a 13.  Patient is alert and oriented x 4, calm, cooperative, and fully engaged in conversation during the encounter.  Patient describes her mood as numb and unhappy.  She reports that she just came back from visiting her son in prison.  Patient exhibits depressed mood  with tearful affect.  Patient denies suicidal or homicidal ideations.  She further denies auditory or visual hallucinations and does not appear to be responding to internal/external stimuli.  Patient endorses poor sleep and states that she has not slept well for the past 2 weeks.  Patient endorses poor appetite.  Patient denies alcohol consumption at this time though she states that she was tempted to drink to numb the pain.   Patient denies tobacco use or illicit drug use.  Visit Diagnosis:    ICD-10-CM   1. Bipolar affective disorder, currently depressed, moderate (HCC)  F31.32 lamoTRIgine  (LAMICTAL ) 25 MG tablet    OLANZapine  (ZYPREXA ) 10 MG tablet    2. GAD (generalized anxiety disorder)  F41.1 busPIRone  (BUSPAR ) 10 MG tablet    3. Sleep disturbances  G47.9 traZODone  (DESYREL ) 50 MG tablet      Past Psychiatric History:  Generalized anxiety disorder Bipolar disorder  06/2022: Seroquel  discontinued due to akathisia.  Patient started on Zyprexa  10 mg nightly and lithium  300 mg twice daily due to presenting hypomanic.  Patient also started on Cogentin  1 mg daily due to akathisia.   07/2022-patient Cogentin  1 mg discontinued due to improvement in symptoms.  Patient Zyprexa  10 mg and lithium  300 mg twice daily continued   08/2022-Lithium  discontinued due to concern for pregnancy   09/2022- Increased Zyprexa  to 5 mg in the morning with 10 mg at night, patient also diagnosed with pseudocyesis.  Patient started on clonidine  0.1 mg twice a day due to impulse control concerns   Patient reports that she has been 3 to 4 days at this facility when she found out her daughter passed away.  Patient also states that this was around the same time she found out that her husband had an affair and he accused her of abandoning family. - Per chart review, patient was admitted to admitted to [facility] ED for evaluation of suicidal ideation with a plan on 01/31/2022.  She informed the staff that she had a plan to jump off a bridge. - Patient was assessed by psychiatry via psych consult on 02/01/2022.   Patient denies a past history of suicide attempt.   Patient denies a past history of homicide attempt.  Past Medical History:  Past Medical History:  Diagnosis Date   Asthma    Bipolar disorder, current episode hypomanic (HCC)     Past Surgical History:  Procedure Laterality Date   arm surgery     CHOLECYSTECTOMY  1997     Family Psychiatric History:  Patient believes that her mother's side of the family suffers from mental illness.  Patient is unsure of the type of illnesses that run in her family due to not being raised with her family. Patient has multiple family members in the jail system including 2 of her sons.   Family history of suicide attempt: Daughter (deceased).  Patient reports that suicide has been frequent in her family. Family history of homicide attempt: Patient denies Family history of substance abuse: Brother (deceased) - died from heroin use.  Family History:  Family History  Problem Relation Age of Onset   Cancer Mother        Possibly Esophageal   Asthma Mother    COPD Mother        smoker   Bipolar disorder Mother        Possibly as well.  Not clear if diagnosed   Seizures Sister     Social History:  Social History   Socioeconomic  History   Marital status: Media Planner    Spouse name: Sudie   Number of children: 4   Years of education: Not on file   Highest education level: Not on file  Occupational History   Not on file  Tobacco Use   Smoking status: Some Days    Types: Cigarettes   Smokeless tobacco: Never  Vaping Use   Vaping status: Never Used  Substance and Sexual Activity   Alcohol use: Yes    Comment: rarely   Drug use: No   Sexual activity: Not Currently  Other Topics Concern   Not on file  Social History Narrative   Lives with a boyfriend on and off for past year.   Her boyfriend travels a lot and she believes he has other relationships with other women.   No others live in the same home, though she feels the neighbors do not like her.     History of trauma   Her middle son and a friend in particular caused significant trauma when she lived in [place]    Social Drivers of Health   Tobacco Use: High Risk (01/13/2025)   Patient History    Smoking Tobacco Use: Some Days    Smokeless Tobacco Use: Never    Passive Exposure: Not on file   Financial Resource Strain: Not on file  Food Insecurity: No Food Insecurity (12/23/2024)   Epic    Worried About Programme Researcher, Broadcasting/film/video in the Last Year: Never true    Ran Out of Food in the Last Year: Never true  Transportation Needs: No Transportation Needs (12/23/2024)   Epic    Lack of Transportation (Medical): No    Lack of Transportation (Non-Medical): No  Physical Activity: Not on file  Stress: Not on file  Social Connections: Not on file  Depression (PHQ2-9): High Risk (01/13/2025)   Depression (PHQ2-9)    PHQ-2 Score: 18  Alcohol Screen: Not on file  Housing: Low Risk (12/23/2024)   Epic    Unable to Pay for Housing in the Last Year: No    Number of Times Moved in the Last Year: 0    Homeless in the Last Year: No  Utilities: Not At Risk (12/23/2024)   Epic    Threatened with loss of utilities: No  Health Literacy: Not on file    Allergies:  Allergies  Allergen Reactions   Seroquel  [Quetiapine ]     Severe akathisia    Metabolic Disorder Labs: Lab Results  Component Value Date   HGBA1C 5.4 06/22/2024   No results found for: PROLACTIN Lab Results  Component Value Date   CHOL 158 09/08/2020   TRIG 52 09/08/2020   HDL 66 09/08/2020   CHOLHDL 2.4 09/08/2020   LDLCALC 81 09/08/2020   Lab Results  Component Value Date   TSH 1.250 09/29/2024   TSH 2.060 09/08/2020    Therapeutic Level Labs: Lab Results  Component Value Date   LITHIUM  <0.1 (L) 09/04/2022   LITHIUM  <0.1 (L) 07/24/2022   No results found for: VALPROATE No results found for: CBMZ  Current Medications: Current Outpatient Medications  Medication Sig Dispense Refill   albuterol  (VENTOLIN  HFA) 108 (90 Base) MCG/ACT inhaler Inhale 2 puffs into the lungs every 6 (six) hours as needed for wheezing or shortness of breath. (Patient not taking: Reported on 12/23/2024) 8.5 g 2   Artificial Saliva (BIOTENE DRY MOUTH MOIST SPRAY) SOLN Use as directed 2 sprays in the mouth or throat at bedtime as  needed.  (Patient not taking: Reported on 12/23/2024) 44.3 mL 2   busPIRone  (BUSPAR ) 10 MG tablet Take 1 tablet (10 mg total) by mouth 2 (two) times daily. 60 tablet 1   cloNIDine  (CATAPRES ) 0.1 MG tablet Take 1 tablet (0.1 mg total) by mouth 2 (two) times daily. (Patient not taking: Reported on 12/23/2024) 60 tablet 11   COVID-19 mRNA vaccine, Moderna, 100 MCG/0.5ML injection Inject into the muscle. (Patient not taking: Reported on 12/23/2024) 0.25 mL 0   ferrous sulfate  325 (65 FE) MG tablet TAKE 1 TABLET BY MOUTH IN THE MORNING AND AT BEDTIME. (Patient not taking: Reported on 12/23/2024) 60 tablet 2   hydrOXYzine  (ATARAX ) 25 MG tablet Take 1 tablet (25 mg total) by mouth 3 (three) times daily as needed. (Patient not taking: Reported on 12/23/2024) 75 tablet 1   lamoTRIgine  (LAMICTAL ) 25 MG tablet Take 2 tablets (50 mg total) by mouth daily. 60 tablet 1   lidocaine  (LIDODERM ) 5 % Place 1 patch onto the skin daily. Remove & Discard patch within 12 hours or as directed by MD (Patient not taking: Reported on 12/23/2024) 30 patch 0   meloxicam  (MOBIC ) 15 MG tablet Take 1 tablet (15 mg total) by mouth daily. 30 tablet 0   methocarbamol  (ROBAXIN ) 500 MG tablet Take 1 tablet (500 mg total) by mouth every 8 (eight) hours as needed for muscle spasms. (Patient not taking: Reported on 12/23/2024) 20 tablet 0   methylPREDNISolone  (MEDROL  DOSEPAK) 4 MG TBPK tablet Take As directed (Patient not taking: Reported on 12/23/2024) 21 each 0   metroNIDAZOLE  (FLAGYL ) 500 MG tablet Take 1 tablet (500 mg total) by mouth 2 (two) times daily. (Patient not taking: Reported on 12/23/2024) 14 tablet 3   mometasone  (ELOCON ) 0.1 % ointment Apply topically 2 (two) times daily. (Patient not taking: Reported on 12/23/2024) 45 g 1   OLANZapine  (ZYPREXA ) 10 MG tablet Take 1 tablet (10 mg total) by mouth at bedtime. 30 tablet 1   ondansetron  (ZOFRAN ) 4 MG tablet Take 1 tablet (4 mg total) by mouth every 6 (six) hours. (Patient not taking: Reported on 12/23/2024) 12  tablet 0   traMADol  (ULTRAM ) 50 MG tablet Take 1 tablet (50 mg total) by mouth every 6 (six) hours as needed for severe pain (pain score 7-10) or moderate pain (pain score 4-6). 20 tablet 0   traZODone  (DESYREL ) 50 MG tablet Take 1 tablet (50 mg total) by mouth at bedtime as needed for sleep. 30 tablet 1   triamcinolone  ointment (KENALOG ) 0.5 % Apply 1 Application topically 2 (two) times daily. 30 g 0   Current Facility-Administered Medications  Medication Dose Route Frequency Provider Last Rate Last Admin   medroxyPROGESTERone  (DEPO-PROVERA ) injection 150 mg  150 mg Intramuscular Q90 days    150 mg at 09/29/24 1015     Musculoskeletal: Strength & Muscle Tone: within normal limits Gait & Station: normal Patient leans: N/A  Psychiatric Specialty Exam: Review of Systems  Psychiatric/Behavioral:  Positive for dysphoric mood and sleep disturbance. Negative for decreased concentration, hallucinations, self-injury and suicidal ideas. The patient is nervous/anxious. The patient is not hyperactive.     Blood pressure 110/83, pulse 82, temperature 98.2 F (36.8 C), temperature source Oral, height 5' 4 (1.626 m), weight 143 lb 3.2 oz (65 kg), SpO2 97%.Body mass index is 24.58 kg/m.  General Appearance: Casual  Eye Contact:  Good  Speech:  Clear and Coherent and Normal Rate  Volume:  Normal  Mood:  Anxious and Depressed  Affect:  Congruent  Thought Process:  Coherent, Goal Directed, and Descriptions of Associations: Intact  Orientation:  Full (Time, Place, and Person)  Thought Content: WDL   Suicidal Thoughts:  No  Homicidal Thoughts:  No  Memory:  Immediate;   Good Recent;   Good Remote;   Good  Judgement:  Good  Insight:  Good  Psychomotor Activity:  Normal  Concentration:  Concentration: Good and Attention Span: Good  Recall:  Good  Fund of Knowledge: Good  Language: Good  Akathisia:  No  Handed:  Right  AIMS (if indicated): not done  Assets:  Communication Skills Desire for  Improvement Financial Resources/Insurance Housing Social Support Vocational/Educational  ADL's:  Intact  Cognition: WNL  Sleep:  Poor   Screenings: AIMS    Flowsheet Row Clinical Support from 01/13/2025 in [facility] Clinical Support from 12/02/2024 in [facility] Clinical Support from 10/21/2024 in [facility] Clinical Support from 08/28/2024 in [facility]  AIMS Total Score 0 0 0 0   GAD-7    Flowsheet Row Clinical Support from 01/13/2025 in [facility] Office Visit from 12/23/2024 in [facility] Clinical Support from 12/02/2024 in [facility] Clinical Support from 10/21/2024 in [facility] Office Visit from 09/29/2024 in [facility] for [facility] Healthcare at [facility]  Total GAD-7 Score 13 0 18 16 15    PHQ2-9    Flowsheet Row Clinical Support from 01/13/2025 in [facility] Office Visit from 12/23/2024 in [facility] Clinical Support from 12/02/2024 in [facility] Clinical Support from 10/21/2024 in [facility] Office Visit from 09/29/2024 in [facility] for [facility] Healthcare at [facility]  PHQ-2 Total Score 5 0 2 4 6   PHQ-9 Total Score 18 6 18 21 17    Flowsheet Row Clinical Support from 01/13/2025 in [facility] Clinical Support from 12/02/2024 in [facility] Clinical Support from 10/21/2024 in [facility]  C-SSRS RISK CATEGORY Moderate Risk Moderate Risk Moderate Risk     Assessment and Plan:   Cristian E. Camacho Burton is a 54 year old female with a past psychiatric history  significant for generalized anxiety disorder and bipolar affective disorder (currently depressed, moderate) who presents to [facility] for follow-up and medication management.  Patient presents to the encounter stating that she has been taking her medications regularly and denies experiencing any adverse side effects.  An aims assessment was performed with the patient scoring a 0.  Since the last encounter, patient has been experiencing worsening depression attributed to recent hardships in her life.  She reports that she recently lost her job on a Wednesday and states that her son was detained by authorities from [place] and will be going to prison.  Since these recent developments, patient reports that her medications have been not as effective.  A PHQ-9 screen was performed with the patient scoring an 18.  A GAD-7 screen was also performed with the patient scoring a 13.  Patient reports that she has found some relief and taking care of her children's pets.  She denies suicidal ideations at this time.  Patient was open to adjusting her medication  dosages following the conclusion of the encounter.  Provider recommended increasing patient's olanzapine  dosage from 7.5 mg to 10 mg at bedtime for mood stability.  Provider also recommended increasing patient's lamotrigine  dosage from 25 mg to 50 mg daily for mood stability and management of her depressive symptoms.  Patient was agreeable to recommendations.  Patient to continue taking all other medications as prescribed.  Patient's medications to be e-prescribed to pharmacy of choice.  An EKG was performed on this patient on 03/10/2024.  Patient's QTc was 456.  Will continue to monitor patient's QTc for future appointments.  A [place] Suicide Severity Rating Scale was performed with the patient being considered moderate risk.  Patient denies suicidal ideations and is able to contract for safety at this time.  Safety  planning was discussed with the patient prior to the conclusion of the encounter.             - Patient was instructed to contact 911 in the event of a mental health crisis. - Patient was instructed to contact [facility] in the event of a mental health crisis. - Patient was instructed to present to [facility] in the event of a mental health crisis.  Collaboration of Care: Collaboration of Care: Medication Management AEB provider managing patient's psychiatric medications, Primary Care Provider AEB patient being followed by internal medicine, Psychiatrist AEB patient being followed by mental health provider at this facility, and Other provider involved in patient's care AEB patient being seen by rehabilitation  Patient/Guardian was advised Release of Information must be obtained prior to any record release in order to collaborate their care with an outside provider. Patient/Guardian was advised if they have not already done so to contact the registration department to sign all necessary forms in order for us  to release information regarding their care.   Consent: Patient/Guardian gives verbal consent for treatment and assignment of benefits for services provided during this visit. Patient/Guardian expressed understanding and agreed to proceed.   1. Bipolar affective disorder, currently depressed, moderate (HCC)  - lamoTRIgine  (LAMICTAL ) 25 MG tablet; Take 2 tablets (50 mg total) by mouth daily.  Dispense: 60 tablet; Refill: 1 - OLANZapine  (ZYPREXA ) 10 MG tablet; Take 1 tablet (10 mg total) by mouth at bedtime.  Dispense: 30 tablet; Refill: 1  2. GAD (generalized anxiety disorder)  - busPIRone  (BUSPAR ) 10 MG tablet; Take 1 tablet (10 mg total) by mouth 2 (two) times daily.  Dispense: 60 tablet; Refill: 1  3. Sleep disturbances  - traZODone  (DESYREL ) 50 MG tablet; Take 1 tablet (50 mg total) by mouth at bedtime as needed for sleep.  Dispense:  30 tablet; Refill: 1  Patient to follow-up in 6 weeks Provider spent a total of 50 minutes with the patient/reviewing patient's chart  Reginia FORBES Bolster, PA 01/13/2025, 1:00 PM

## 2025-02-16 ENCOUNTER — Ambulatory Visit (HOSPITAL_COMMUNITY)

## 2025-02-24 ENCOUNTER — Encounter (HOSPITAL_COMMUNITY): Admitting: Physician Assistant
